# Patient Record
Sex: Female | Born: 1990 | Race: Black or African American | Hispanic: No | Marital: Married | Smoking: Never smoker
Health system: Southern US, Community
[De-identification: ages and names within clinical notes are randomized; demographics above are authoritative.]

## PROBLEM LIST (undated history)

## (undated) ENCOUNTER — Inpatient Hospital Stay (HOSPITAL_COMMUNITY): Payer: Self-pay

## (undated) DIAGNOSIS — D649 Anemia, unspecified: Secondary | ICD-10-CM

## (undated) DIAGNOSIS — J45909 Unspecified asthma, uncomplicated: Secondary | ICD-10-CM

## (undated) HISTORY — PX: NO PAST SURGERIES: SHX2092

---
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
## 2014-09-22 ENCOUNTER — Other Ambulatory Visit (HOSPITAL_COMMUNITY)
Admission: RE | Admit: 2014-09-22 | Discharge: 2014-09-22 | Disposition: A | Discharge status: Home / Self Care | Payer: 59 | Source: Ambulatory Visit | Attending: Internal Medicine | Admitting: Internal Medicine

## 2014-09-22 DIAGNOSIS — Z01411 Encounter for gynecological examination (general) (routine) with abnormal findings: Secondary | ICD-10-CM | POA: Insufficient documentation

## 2015-04-19 NOTE — L&D Delivery Note (Signed)
Delivery Note At 2:45 PM a viable female was delivered via Vaginal, Spontaneous Delivery (Presentation:ROA ;  ).  APGAR: 8, 9; weight pending .   Placenta status: Intact, spontaneous.  Marginal insertion of cord.  Cord:  with the following complications: Tight nuchal cord, surgically reduced .  Cord pH: n/a  Pt counseled on VAVD, bell vacuum pulled but not used.  Anesthesia:  Epidural Episiotomy: None Lacerations: Periurethral, vaginal laceration, right vaginal fornix Suture Repair: 4.0 Vicryl, 3.0 Monocryl, 2.0 Chromic Est. Blood Loss (mL):  150  Mom to postpartum.  Baby to Couplet care / Skin to Skin.  Geryl RankinsVARNADO, Dari Carpenito 01/19/2016, 3:26 PM

## 2015-06-01 LAB — OB RESULTS CONSOLE HEPATITIS B SURFACE ANTIGEN: HEP B S AG: NEGATIVE

## 2015-06-01 LAB — OB RESULTS CONSOLE RPR: RPR: NONREACTIVE

## 2015-06-01 LAB — OB RESULTS CONSOLE RUBELLA ANTIBODY, IGM: Rubella: IMMUNE

## 2015-06-01 LAB — OB RESULTS CONSOLE HIV ANTIBODY (ROUTINE TESTING): HIV: NONREACTIVE

## 2015-06-29 LAB — OB RESULTS CONSOLE GC/CHLAMYDIA
Chlamydia: NEGATIVE
Gonorrhea: NEGATIVE

## 2015-12-09 ENCOUNTER — Encounter (HOSPITAL_COMMUNITY): Payer: Self-pay | Admitting: *Deleted

## 2015-12-09 ENCOUNTER — Inpatient Hospital Stay (HOSPITAL_COMMUNITY)
Admission: AD | Admit: 2015-12-09 | Discharge: 2015-12-10 | Disposition: A | Discharge status: Home / Self Care | Payer: BLUE CROSS/BLUE SHIELD | Source: Ambulatory Visit | Attending: Obstetrics and Gynecology | Admitting: Obstetrics and Gynecology

## 2015-12-09 DIAGNOSIS — Z3A33 33 weeks gestation of pregnancy: Secondary | ICD-10-CM | POA: Diagnosis not present

## 2015-12-09 DIAGNOSIS — O26893 Other specified pregnancy related conditions, third trimester: Secondary | ICD-10-CM | POA: Diagnosis not present

## 2015-12-09 DIAGNOSIS — N898 Other specified noninflammatory disorders of vagina: Secondary | ICD-10-CM | POA: Diagnosis not present

## 2015-12-09 HISTORY — DX: Anemia, unspecified: D64.9

## 2015-12-09 LAB — WET PREP, GENITAL
Clue Cells Wet Prep HPF POC: NONE SEEN
SPERM: NONE SEEN
Trich, Wet Prep: NONE SEEN
YEAST WET PREP: NONE SEEN

## 2015-12-09 NOTE — MAU Provider Note (Signed)
Chief Complaint:  No chief complaint on file.     HPI: Megan MiresBrittnee Baskin is a 25 y.o. G1P0 at 7741w3d who presents to maternity admissions reporting her "water broke".  Earlier this evening, patient went to go to the bathroom, and when she pulled down her underwear, she noticed the underwear was completely wet, not soaked. She did not recall feeling any gush. She has not had any further leaking of fluid. No recent intercourse. No history of STDs. No abnormal vaginal discharge.   Denies contractions or vaginal bleeding. Good fetal movement.   Pregnancy Course:  Uncomplicated  Past Medical History: Past Medical History:  Diagnosis Date  . Anemia     Past obstetric history: OB History  Gravida Para Term Preterm AB Living  1            SAB TAB Ectopic Multiple Live Births               # Outcome Date GA Lbr Len/2nd Weight Sex Delivery Anes PTL Lv  1 Current               Past Surgical History: History reviewed. No pertinent surgical history.   Family History: No family history on file.  Social History: Social History  Substance Use Topics  . Smoking status: Never Smoker  . Smokeless tobacco: Never Used  . Alcohol use Not on file    Allergies: Allergies not on file  Meds:  No prescriptions prior to admission.    I have reviewed patient's Past Medical Hx, Surgical Hx, Family Hx, Social Hx, medications and allergies.   ROS:  Review of Systems  Physical Exam   Patient Vitals for the past 24 hrs:  BP Temp Temp src Pulse Resp SpO2 Height Weight  12/09/15 2346 125/55 98.3 F (36.8 C) Oral 75 18 - - -  12/09/15 2128 113/81 98.5 F (36.9 C) Oral 80 16 98 % 5\' 4"  (1.626 m) 202 lb (91.6 kg)   Constitutional: Well-developed, well-nourished female in no acute distress.  Cardiovascular: normal rate Respiratory: normal effort GI: Abd soft, non-tender, gravid appropriate for gestational age. Pos BS x 4 MS: Extremities nontender, no edema, normal ROM Neurologic: Alert and  oriented x 4.  GU: Neg CVAT.  Pelvic: NEFG, physiologic discharge, no blood, cervix clean. No CMT. NO POOLING.  Dilation: Closed Effacement (%): Thick Cervical Position: Posterior Exam by:: dR. Kerisha Goughnour   FHT:  Baseline 140, moderate variability, accelerations present, no decelerations Contractions: quiet, rare   Labs: Results for orders placed or performed during the hospital encounter of 12/09/15 (from the past 24 hour(s))  Wet prep, genital     Status: Abnormal   Collection Time: 12/09/15 11:13 PM  Result Value Ref Range   Yeast Wet Prep HPF POC NONE SEEN NONE SEEN   Trich, Wet Prep NONE SEEN NONE SEEN   Clue Cells Wet Prep HPF POC NONE SEEN NONE SEEN   WBC, Wet Prep HPF POC MANY (A) NONE SEEN   Sperm NONE SEEN    FERN TEST NEGATIVE   Imaging:  No results found.  MAU Course: - SSE: NO pooling, normal physiologic discharge - Negative fern - Wet mount: Negative, many WBC  11:55 PM - Spoke with Dr. Richardson Doppole and Dr. Su Hiltoberts, who agree with the plan   MDM: Plan of care reviewed with patient, including labs and tests ordered and medical treatment.   Assessment: 1. Vaginal discharge during pregnancy in third trimester     Plan: Discharge home in  stable condition.  Labor precautions and fetal kick counts Follow-up Information    COLE,TARA J., MD Follow up in 1 week(s).   Specialty:  Obstetrics and Gynecology Why:  Routine OB follow up Contact information: [address] [place] [id number] [phone]             Medication List    You have not been prescribed any medications.     [address] [place], [place] 12/09/2015 11:55 PM

## 2015-12-09 NOTE — MAU Note (Signed)
Pt reports at 1800 she went to the restroom and her underwear was wet, called the doctor and was told to come in. Has not noticed anymore fluid. Denies pain.

## 2015-12-09 NOTE — Discharge Instructions (Signed)

## 2015-12-10 ENCOUNTER — Encounter (HOSPITAL_COMMUNITY): Payer: Self-pay

## 2015-12-10 DIAGNOSIS — O26893 Other specified pregnancy related conditions, third trimester: Secondary | ICD-10-CM | POA: Diagnosis not present

## 2016-01-12 ENCOUNTER — Inpatient Hospital Stay (HOSPITAL_COMMUNITY)
Admission: AD | Admit: 2016-01-12 | Discharge: 2016-01-12 | Disposition: A | Discharge status: Home / Self Care | Payer: BLUE CROSS/BLUE SHIELD | Source: Ambulatory Visit | Attending: Obstetrics and Gynecology | Admitting: Obstetrics and Gynecology

## 2016-01-12 ENCOUNTER — Encounter (HOSPITAL_COMMUNITY): Payer: Self-pay | Admitting: *Deleted

## 2016-01-12 DIAGNOSIS — Z3493 Encounter for supervision of normal pregnancy, unspecified, third trimester: Secondary | ICD-10-CM | POA: Diagnosis present

## 2016-01-12 DIAGNOSIS — Z3A39 39 weeks gestation of pregnancy: Secondary | ICD-10-CM | POA: Diagnosis not present

## 2016-01-12 NOTE — Discharge Instructions (Signed)
Fetal Movement Counts °Patient Name: __________________________________________________ Patient Due Date: ____________________ °Performing a fetal movement count is highly recommended in high-risk pregnancies, but it is good for every pregnant woman to do. Your health care provider may ask you to start counting fetal movements at 28 weeks of the pregnancy. Fetal movements often increase: °· After eating a full meal. °· After physical activity. °· After eating or drinking something sweet or cold. °· At rest. °Pay attention to when you feel the baby is most active. This will help you notice a pattern of your baby's sleep and wake cycles and what factors contribute to an increase in fetal movement. It is important to perform a fetal movement count at the same time each day when your baby is normally most active.  °HOW TO COUNT FETAL MOVEMENTS °1. Find a quiet and comfortable area to sit or lie down on your left side. Lying on your left side provides the best blood and oxygen circulation to your baby. °2. Write down the day and time on a sheet of paper or in a journal. °3. Start counting kicks, flutters, swishes, rolls, or jabs in a 2-hour period. You should feel at least 10 movements within 2 hours. °4. If you do not feel 10 movements in 2 hours, wait 2-3 hours and count again. Look for a change in the pattern or not enough counts in 2 hours. °SEEK MEDICAL CARE IF: °· You feel less than 10 counts in 2 hours, tried twice. °· There is no movement in over an hour. °· The pattern is changing or taking longer each day to reach 10 counts in 2 hours. °· You feel the baby is not moving as he or she usually does. °Date: ____________ Movements: ____________ Start time: ____________ Finish time: ____________  °Date: ____________ Movements: ____________ Start time: ____________ Finish time: ____________ °Date: ____________ Movements: ____________ Start time: ____________ Finish time: ____________ °Date: ____________ Movements:  ____________ Start time: ____________ Finish time: ____________ °Date: ____________ Movements: ____________ Start time: ____________ Finish time: ____________ °Date: ____________ Movements: ____________ Start time: ____________ Finish time: ____________ °Date: ____________ Movements: ____________ Start time: ____________ Finish time: ____________ °Date: ____________ Movements: ____________ Start time: ____________ Finish time: ____________  °Date: ____________ Movements: ____________ Start time: ____________ Finish time: ____________ °Date: ____________ Movements: ____________ Start time: ____________ Finish time: ____________ °Date: ____________ Movements: ____________ Start time: ____________ Finish time: ____________ °Date: ____________ Movements: ____________ Start time: ____________ Finish time: ____________ °Date: ____________ Movements: ____________ Start time: ____________ Finish time: ____________ °Date: ____________ Movements: ____________ Start time: ____________ Finish time: ____________ °Date: ____________ Movements: ____________ Start time: ____________ Finish time: ____________  °Date: ____________ Movements: ____________ Start time: ____________ Finish time: ____________ °Date: ____________ Movements: ____________ Start time: ____________ Finish time: ____________ °Date: ____________ Movements: ____________ Start time: ____________ Finish time: ____________ °Date: ____________ Movements: ____________ Start time: ____________ Finish time: ____________ °Date: ____________ Movements: ____________ Start time: ____________ Finish time: ____________ °Date: ____________ Movements: ____________ Start time: ____________ Finish time: ____________ °Date: ____________ Movements: ____________ Start time: ____________ Finish time: ____________  °Date: ____________ Movements: ____________ Start time: ____________ Finish time: ____________ °Date: ____________ Movements: ____________ Start time: ____________ Finish  time: ____________ °Date: ____________ Movements: ____________ Start time: ____________ Finish time: ____________ °Date: ____________ Movements: ____________ Start time: ____________ Finish time: ____________ °Date: ____________ Movements: ____________ Start time: ____________ Finish time: ____________ °Date: ____________ Movements: ____________ Start time: ____________ Finish time: ____________ °Date: ____________ Movements: ____________ Start time: ____________ Finish time: ____________  °Date: ____________ Movements: ____________ Start time: ____________ Finish   time: ____________ °Date: ____________ Movements: ____________ Start time: ____________ Finish time: ____________ °Date: ____________ Movements: ____________ Start time: ____________ Finish time: ____________ °Date: ____________ Movements: ____________ Start time: ____________ Finish time: ____________ °Date: ____________ Movements: ____________ Start time: ____________ Finish time: ____________ °Date: ____________ Movements: ____________ Start time: ____________ Finish time: ____________ °Date: ____________ Movements: ____________ Start time: ____________ Finish time: ____________  °Date: ____________ Movements: ____________ Start time: ____________ Finish time: ____________ °Date: ____________ Movements: ____________ Start time: ____________ Finish time: ____________ °Date: ____________ Movements: ____________ Start time: ____________ Finish time: ____________ °Date: ____________ Movements: ____________ Start time: ____________ Finish time: ____________ °Date: ____________ Movements: ____________ Start time: ____________ Finish time: ____________ °Date: ____________ Movements: ____________ Start time: ____________ Finish time: ____________ °Date: ____________ Movements: ____________ Start time: ____________ Finish time: ____________  °Date: ____________ Movements: ____________ Start time: ____________ Finish time: ____________ °Date: ____________  Movements: ____________ Start time: ____________ Finish time: ____________ °Date: ____________ Movements: ____________ Start time: ____________ Finish time: ____________ °Date: ____________ Movements: ____________ Start time: ____________ Finish time: ____________ °Date: ____________ Movements: ____________ Start time: ____________ Finish time: ____________ °Date: ____________ Movements: ____________ Start time: ____________ Finish time: ____________ °Date: ____________ Movements: ____________ Start time: ____________ Finish time: ____________  °Date: ____________ Movements: ____________ Start time: ____________ Finish time: ____________ °Date: ____________ Movements: ____________ Start time: ____________ Finish time: ____________ °Date: ____________ Movements: ____________ Start time: ____________ Finish time: ____________ °Date: ____________ Movements: ____________ Start time: ____________ Finish time: ____________ °Date: ____________ Movements: ____________ Start time: ____________ Finish time: ____________ °Date: ____________ Movements: ____________ Start time: ____________ Finish time: ____________ °  °This information is not intended to replace advice given to you by your health care provider. Make sure you discuss any questions you have with your health care provider. °  °Document Released: 05/04/2006 Document Revised: 04/25/2014 Document Reviewed: 01/30/2012 °Elsevier Interactive Patient Education ©2016 [employer] °Braxton Hicks Contractions °Contractions of the uterus can occur throughout pregnancy. Contractions are not always a sign that you are in labor.  °WHAT ARE BRAXTON HICKS CONTRACTIONS?  °Contractions that occur before labor are called Braxton Hicks contractions, or false labor. Toward the end of pregnancy (32-34 weeks), these contractions can develop more often and may become more forceful. This is not true labor because these contractions do not result in opening (dilatation) and thinning of  the cervix. They are sometimes difficult to tell apart from true labor because these contractions can be forceful and people have different pain tolerances. You should not feel embarrassed if you go to the hospital with false labor. Sometimes, the only way to tell if you are in true labor is for your health care provider to look for changes in the cervix. °If there are no prenatal problems or other health problems associated with the pregnancy, it is completely safe to be sent home with false labor and await the onset of true labor. °HOW CAN YOU TELL THE DIFFERENCE BETWEEN TRUE AND FALSE LABOR? °False Labor °· The contractions of false labor are usually shorter and not as hard as those of true labor.   °· The contractions are usually irregular.   °· The contractions are often felt in the front of the lower abdomen and in the groin.   °· The contractions may go away when you walk around or change positions while lying down.   °· The contractions get weaker and are shorter lasting as time goes on.   °· The contractions do not usually become progressively stronger, regular, and closer together as with true labor.   °True Labor °· Contractions in true   labor last 30-70 seconds, become very regular, usually become more intense, and increase in frequency.   °· The contractions do not go away with walking.   °· The discomfort is usually felt in the top of the uterus and spreads to the lower abdomen and low back.   °· True labor can be determined by your health care provider with an exam. This will show that the cervix is dilating and getting thinner.   °WHAT TO REMEMBER °· Keep up with your usual exercises and follow other instructions given by your health care provider.   °· Take medicines as directed by your health care provider.   °· Keep your regular prenatal appointments.   °· Eat and drink lightly if you think you are going into labor.   °· If Braxton Hicks contractions are making you uncomfortable:   °¨ Change your  position from lying down or resting to walking, or from walking to resting.   °¨ Sit and rest in a tub of warm water.   °¨ Drink 2-3 glasses of water. Dehydration may cause these contractions.   °¨ Do slow and deep breathing several times an hour.   °WHEN SHOULD I SEEK IMMEDIATE MEDICAL CARE? °Seek immediate medical care if: °· Your contractions become stronger, more regular, and closer together.   °· You have fluid leaking or gushing from your vagina.   °· You have a fever.   °· You pass blood-tinged mucus.   °· You have vaginal bleeding.   °· You have continuous abdominal pain.   °· You have low back pain that you never had before.   °· You feel your baby's head pushing down and causing pelvic pressure.   °· Your baby is not moving as much as it used to.   °  °This information is not intended to replace advice given to you by your health care provider. Make sure you discuss any questions you have with your health care provider. °  °Document Released: 04/04/2005 Document Revised: 04/09/2013 Document Reviewed: 01/14/2013 °Elsevier Interactive Patient Education ©2016 [employer] ° °

## 2016-01-12 NOTE — MAU Note (Signed)
Pt reports uc's since 2300

## 2016-01-18 ENCOUNTER — Inpatient Hospital Stay (HOSPITAL_COMMUNITY): Payer: BLUE CROSS/BLUE SHIELD | Admitting: Anesthesiology

## 2016-01-18 ENCOUNTER — Encounter (HOSPITAL_COMMUNITY): Payer: Self-pay | Admitting: *Deleted

## 2016-01-18 ENCOUNTER — Inpatient Hospital Stay (HOSPITAL_COMMUNITY)
Admission: AD | Admit: 2016-01-18 | Discharge: 2016-01-21 | DRG: 775 | Disposition: A | Discharge status: Home / Self Care | Payer: BLUE CROSS/BLUE SHIELD | Source: Ambulatory Visit | Attending: Obstetrics and Gynecology | Admitting: Obstetrics and Gynecology

## 2016-01-18 DIAGNOSIS — Z8249 Family history of ischemic heart disease and other diseases of the circulatory system: Secondary | ICD-10-CM | POA: Diagnosis not present

## 2016-01-18 DIAGNOSIS — K5900 Constipation, unspecified: Secondary | ICD-10-CM | POA: Diagnosis present

## 2016-01-18 DIAGNOSIS — Z3A40 40 weeks gestation of pregnancy: Secondary | ICD-10-CM | POA: Diagnosis not present

## 2016-01-18 DIAGNOSIS — J45909 Unspecified asthma, uncomplicated: Secondary | ICD-10-CM | POA: Diagnosis present

## 2016-01-18 DIAGNOSIS — O411230 Chorioamnionitis, third trimester, not applicable or unspecified: Secondary | ICD-10-CM | POA: Diagnosis present

## 2016-01-18 DIAGNOSIS — O4202 Full-term premature rupture of membranes, onset of labor within 24 hours of rupture: Principal | ICD-10-CM | POA: Diagnosis present

## 2016-01-18 DIAGNOSIS — O4290 Premature rupture of membranes, unspecified as to length of time between rupture and onset of labor, unspecified weeks of gestation: Secondary | ICD-10-CM | POA: Diagnosis present

## 2016-01-18 DIAGNOSIS — O43123 Velamentous insertion of umbilical cord, third trimester: Secondary | ICD-10-CM | POA: Diagnosis present

## 2016-01-18 DIAGNOSIS — O9952 Diseases of the respiratory system complicating childbirth: Secondary | ICD-10-CM | POA: Diagnosis present

## 2016-01-18 LAB — CBC
HCT: 35.8 % — ABNORMAL LOW (ref 36.0–46.0)
Hemoglobin: 12 g/dL (ref 12.0–15.0)
MCH: 26.5 pg (ref 26.0–34.0)
MCHC: 33.5 g/dL (ref 30.0–36.0)
MCV: 79.2 fL (ref 78.0–100.0)
PLATELETS: 149 10*3/uL — AB (ref 150–400)
RBC: 4.52 MIL/uL (ref 3.87–5.11)
RDW: 14.7 % (ref 11.5–15.5)
WBC: 10.6 10*3/uL — AB (ref 4.0–10.5)

## 2016-01-18 LAB — TYPE AND SCREEN
ABO/RH(D): A POS
Antibody Screen: NEGATIVE

## 2016-01-18 LAB — ABO/RH: ABO/RH(D): A POS

## 2016-01-18 LAB — POCT FERN TEST

## 2016-01-18 MED ORDER — FENTANYL 2.5 MCG/ML BUPIVACAINE 1/10 % EPIDURAL INFUSION (WH - ANES)
14.0000 mL/h | INTRAMUSCULAR | Status: DC | PRN
  Administered 2016-01-18 – 2016-01-19 (×3): 14 mL/h via EPIDURAL
  Filled 2016-01-18 (×3): qty 125

## 2016-01-18 MED ORDER — LACTATED RINGERS IV SOLN: 500.0000 mL | INTRAVENOUS | Status: DC | PRN

## 2016-01-18 MED ORDER — HYDROXYZINE HCL 50 MG PO TABS
50.0000 mg | ORAL_TABLET | Freq: Four times a day (QID) | ORAL | Status: DC | PRN
  Filled 2016-01-18: qty 1

## 2016-01-18 MED ORDER — LIDOCAINE HCL (PF) 1 % IJ SOLN
INTRAMUSCULAR | Status: DC | PRN
  Administered 2016-01-18 (×2): 5 mL

## 2016-01-18 MED ORDER — OXYTOCIN 40 UNITS IN LACTATED RINGERS INFUSION - SIMPLE MED
1.0000 m[IU]/min | INTRAVENOUS | Status: DC
  Administered 2016-01-18: 1 m[IU]/min via INTRAVENOUS
  Filled 2016-01-18: qty 1000

## 2016-01-18 MED ORDER — SOD CITRATE-CITRIC ACID 500-334 MG/5ML PO SOLN
30.0000 mL | ORAL | Status: DC | PRN
Start: 2016-01-18 — End: 2016-01-19
  Filled 2016-01-18: qty 15

## 2016-01-18 MED ORDER — FENTANYL CITRATE (PF) 100 MCG/2ML IJ SOLN
100.0000 ug | INTRAMUSCULAR | Status: DC | PRN
  Administered 2016-01-18 (×2): 100 ug via INTRAVENOUS
  Filled 2016-01-18 (×2): qty 2

## 2016-01-18 MED ORDER — ACETAMINOPHEN 325 MG PO TABS
650.0000 mg | ORAL_TABLET | ORAL | Status: DC | PRN
  Administered 2016-01-19: 650 mg via ORAL
  Filled 2016-01-18: qty 2

## 2016-01-18 MED ORDER — LIDOCAINE HCL (PF) 1 % IJ SOLN
30.0000 mL | INTRAMUSCULAR | Status: DC | PRN
  Filled 2016-01-18: qty 30

## 2016-01-18 MED ORDER — FENTANYL CITRATE (PF) 100 MCG/2ML IJ SOLN: 50.0000 ug | INTRAMUSCULAR | Status: DC | PRN

## 2016-01-18 MED ORDER — DIPHENHYDRAMINE HCL 50 MG/ML IJ SOLN: 12.5000 mg | INTRAMUSCULAR | Status: DC | PRN

## 2016-01-18 MED ORDER — OXYTOCIN 40 UNITS IN LACTATED RINGERS INFUSION - SIMPLE MED
2.5000 [IU]/h | INTRAVENOUS | Status: DC
  Administered 2016-01-19: 2.5 [IU]/h via INTRAVENOUS
  Filled 2016-01-18: qty 1000

## 2016-01-18 MED ORDER — ONDANSETRON HCL 4 MG/2ML IJ SOLN
4.0000 mg | Freq: Four times a day (QID) | INTRAMUSCULAR | Status: DC | PRN
  Administered 2016-01-19: 4 mg via INTRAVENOUS
  Filled 2016-01-18: qty 2

## 2016-01-18 MED ORDER — PHENYLEPHRINE 40 MCG/ML (10ML) SYRINGE FOR IV PUSH (FOR BLOOD PRESSURE SUPPORT)
80.0000 ug | PREFILLED_SYRINGE | INTRAVENOUS | Status: DC | PRN
  Filled 2016-01-18: qty 10
  Filled 2016-01-18: qty 5

## 2016-01-18 MED ORDER — TERBUTALINE SULFATE 1 MG/ML IJ SOLN
0.2500 mg | Freq: Once | INTRAMUSCULAR | Status: DC | PRN
  Filled 2016-01-18: qty 1

## 2016-01-18 MED ORDER — OXYCODONE-ACETAMINOPHEN 5-325 MG PO TABS: 2.0000 | ORAL_TABLET | ORAL | Status: DC | PRN

## 2016-01-18 MED ORDER — EPHEDRINE 5 MG/ML INJ
10.0000 mg | INTRAVENOUS | Status: DC | PRN
Start: 2016-01-18 — End: 2016-01-19
  Filled 2016-01-18: qty 4

## 2016-01-18 MED ORDER — OXYTOCIN BOLUS FROM INFUSION
500.0000 mL | Freq: Once | INTRAVENOUS | Status: AC
  Administered 2016-01-19: 500 mL via INTRAVENOUS

## 2016-01-18 MED ORDER — ZOLPIDEM TARTRATE 5 MG PO TABS: 5.0000 mg | ORAL_TABLET | Freq: Every evening | ORAL | Status: DC | PRN

## 2016-01-18 MED ORDER — LACTATED RINGERS IV SOLN
INTRAVENOUS | Status: DC
  Administered 2016-01-18 – 2016-01-19 (×2): via INTRAVENOUS

## 2016-01-18 MED ORDER — OXYCODONE-ACETAMINOPHEN 5-325 MG PO TABS: 1.0000 | ORAL_TABLET | ORAL | Status: DC | PRN

## 2016-01-18 MED ORDER — EPHEDRINE 5 MG/ML INJ
10.0000 mg | INTRAVENOUS | Status: DC | PRN
  Filled 2016-01-18: qty 4

## 2016-01-18 MED ORDER — PHENYLEPHRINE 40 MCG/ML (10ML) SYRINGE FOR IV PUSH (FOR BLOOD PRESSURE SUPPORT)
80.0000 ug | PREFILLED_SYRINGE | INTRAVENOUS | Status: DC | PRN
  Filled 2016-01-18: qty 5

## 2016-01-18 MED ORDER — LACTATED RINGERS IV SOLN
500.0000 mL | Freq: Once | INTRAVENOUS | Status: AC
  Administered 2016-01-18: 500 mL via INTRAVENOUS

## 2016-01-18 NOTE — MAU Note (Signed)
Urine in lab 

## 2016-01-18 NOTE — MAU Note (Signed)
Had sex @ 0500 then ucs started around 0600; ?SROM since 0650;

## 2016-01-18 NOTE — Anesthesia Procedure Notes (Signed)
Epidural Patient location during procedure: OB  Staffing Anesthesiologist: Harrell Niehoff Performed: anesthesiologist   Preanesthetic Checklist Completed: patient identified, site marked, surgical consent, pre-op evaluation, timeout performed, IV checked, risks and benefits discussed and monitors and equipment checked  Epidural Patient position: sitting Prep: DuraPrep Patient monitoring: heart rate, continuous pulse ox and blood pressure Approach: right paramedian Location: L4-L5 Injection technique: LOR saline  Needle:  Needle type: Tuohy  Needle gauge: 17 G Needle length: 9 cm and 9 Needle insertion depth: 6 cm Catheter type: closed end flexible Catheter size: 20 Guage Catheter at skin depth: 10 cm Test dose: negative  Assessment Events: blood not aspirated, injection not painful, no injection resistance, negative IV test and no paresthesia  Additional Notes Patient identified. Risks/Benefits/Options discussed with patient including but not limited to bleeding, infection, nerve damage, paralysis, failed block, incomplete pain control, headache, blood pressure changes, nausea, vomiting, reactions to medication both or allergic, itching and postpartum back pain. Confirmed with bedside nurse the patient's most recent platelet count. Confirmed with patient that they are not currently taking any anticoagulation, have any bleeding history or any family history of bleeding disorders. Patient expressed understanding and wished to proceed. All questions were answered. Sterile technique was used throughout the entire procedure. Please see nursing notes for vital signs. Test dose was given through epidural needle and negative prior to continuing to dose epidural or start infusion. Warning signs of high block given to the patient including shortness of breath, tingling/numbness in hands, complete motor block, or any concerning symptoms with instructions to call for help. Patient was given  instructions on fall risk and not to get out of bed. All questions and concerns addressed with instructions to call with any issues.     

## 2016-01-18 NOTE — Anesthesia Preprocedure Evaluation (Signed)
Anesthesia Evaluation  Patient identified by MRN, date of birth, ID band Patient awake    Reviewed: Allergy & Precautions, H&P , NPO status , Patient's Chart, lab work & pertinent test results  History of Anesthesia Complications Negative for: history of anesthetic complications  Airway Mallampati: II  TM Distance: >3 FB Neck ROM: full    Dental no notable dental hx. (+) Teeth Intact   Pulmonary asthma ,    Pulmonary exam normal breath sounds clear to auscultation       Cardiovascular negative cardio ROS Normal cardiovascular exam Rhythm:regular Rate:Normal     Neuro/Psych negative neurological ROS  negative psych ROS   GI/Hepatic negative GI ROS, Neg liver ROS,   Endo/Other  negative endocrine ROS  Renal/GU negative Renal ROS  negative genitourinary   Musculoskeletal   Abdominal   Peds  Hematology negative hematology ROS (+)   Anesthesia Other Findings   Reproductive/Obstetrics (+) Pregnancy                             Anesthesia Physical Anesthesia Plan  ASA: II  Anesthesia Plan: Epidural   Post-op Pain Management:    Induction:   Airway Management Planned:   Additional Equipment:   Intra-op Plan:   Post-operative Plan:   Informed Consent: I have reviewed the patients History and Physical, chart, labs and discussed the procedure including the risks, benefits and alternatives for the proposed anesthesia with the patient or authorized representative who has indicated his/her understanding and acceptance.     Plan Discussed with:   Anesthesia Plan Comments:         Anesthesia Quick Evaluation  

## 2016-01-18 NOTE — Anesthesia Pain Management Evaluation Note (Signed)
  [facility] Pain Management Visit Note  Patient: Megan Cobb, 25 y.o., female  "Hello I am a member of the anesthesia team at [facility]. We have an anesthesia team available at all times to provide care throughout the hospital, including epidural management and anesthesia for C-section. I don't know your plan for the delivery whether it a natural birth, water birth, IV sedation, nitrous supplementation, doula or epidural, but we want to meet your pain goals."   1.Was your pain managed to your expectations on prior hospitalizations?   No prior hospitalizations  2.What is your expectation for pain management during this hospitalization?     Epidural  3.How can we help you reach that goal? Explaining the options for labor & pain control  Record the patient's initial score and the patient's pain goal.   Pain: 1  Pain Goal: 6 [facility] wants you to be able to say your pain was always managed very well.  Jomarion Mish 01/18/2016

## 2016-01-18 NOTE — H&P (Signed)
Megan Cobb is a 25 y.o. female G1 @ 40 1/7 weeks by an 11 week ultrasound presenting for ruptured membranes. Pt had intercourse overnight, then started having mild cramps.  She was able sleep overnight.  Upon awakening, pt still had cramping.  Reported SROM 0650. Clear. Pt states contractions are mild currently but were stronger previously.  Cervix is unchanged from office 3 days ago. Prenatal care with [facility] (Dr. Dion BodyVarnado).  OB History    Gravida Para Term Preterm AB Living   1             SAB TAB Ectopic Multiple Live Births                 Past Medical History:  Diagnosis Date  . Anemia    Past Surgical History:  Procedure Laterality Date  . NO PAST SURGERIES     Family History: family history includes Hypertension in her father and mother. Social History:  reports that she has never smoked. She has never used smokeless tobacco. She reports that she does not drink alcohol or use drugs.     Maternal Diabetes: No Genetic Screening: Normal Maternal Ultrasounds/Referrals: Normal Fetal Ultrasounds or other Referrals:  None Maternal Substance Abuse:  No Significant Maternal Medications:  None Significant Maternal Lab Results:  Lab values include: Group B Strep negative Other Comments:  None  Review of Systems  Constitutional: Negative for chills and fever.  Gastrointestinal: Positive for abdominal pain.   Maternal Medical History:  Reason for admission: Rupture of membranes.   Contractions: Onset was 6-12 hours ago.   Frequency: irregular.   Perceived severity is mild.    Fetal activity: Perceived fetal activity is normal.    Prenatal complications: no prenatal complications Prenatal Complications - Diabetes: none.      Blood pressure 122/68, pulse 91, temperature 98.2 F (36.8 C), temperature source Oral, resp. rate 16. Maternal Exam:  Uterine Assessment: Contraction strength is moderate.  Contraction frequency is irregular.   Abdomen: Patient  reports no abdominal tenderness. Estimated fetal weight is recent ultrasound less than 7 lbs.   Fetal presentation: vertex  Introitus: Normal vulva. Normal vagina.  Ferning test: positive.  Amniotic fluid character: clear. Pooling of mucoid discharge.  Pelvis: adequate for delivery.   Cervix: Cervix evaluated by digital exam.     Fetal Exam Fetal Monitor Review: Mode: fetoscope.   Baseline rate: 130s.  Variability: moderate (6-25 bpm).   Pattern: accelerations present, variable decelerations and early decelerations.    Fetal State Assessment: Category II - tracings are indeterminate.    Contractions every 8-11 minutes  Physical Exam  Constitutional: She is oriented to person, place, and time. She appears well-developed and well-nourished. No distress.  HENT:  Head: Normocephalic and atraumatic.  Eyes: EOM are normal.  Neck: Normal range of motion.  Respiratory: Effort normal and breath sounds normal. No respiratory distress.  GI: There is no tenderness.  Musculoskeletal: Normal range of motion. She exhibits no edema.  Neurological: She is alert and oriented to person, place, and time.  Skin: Skin is warm and dry.  Psychiatric: She has a normal mood and affect.    Prenatal labs: ABO, Rh:   Antibody:   Rubella:   RPR:    HBsAg:    HIV:    GBS:   Negative  Assessment/Plan: IUP @ 40 1/7 weeks PROM x4+ hours.  No s/sxs of infection. Admit for induction of labor. Start Pitocin. Prophylactic antibiotics 18-24 hours s/p ROM. Fentanyl, Nitrous  Oxide.  Epidural in active labor. Dr. Richardson Dopp to assume care at 1 pm,  CCOB at 7 pm.  Pt informed.  Tyion Boylen 01/18/2016, 11:15 AM

## 2016-01-18 NOTE — Progress Notes (Signed)
Subjective: Pt is a 25 yo G1PO at 40.1 IUP in early active labor.  Pt had SROM of clear fluid at 0650.  Pt is GBS negative.  Pt states contractions getting stronger and just received some pain medication  Objective: BP 110/78 (BP Location: Left Arm)   Pulse 86   Temp 97.9 F (36.6 C) (Oral)   Resp 16  No intake/output data recorded. No intake/output data recorded.  FHT: Category 1 UC:   regular, every 5 minutes SVE:   Dilation: 1 Effacement (%): Thick Station: -2 Exam by:: A. Gagliardo, RN Pitocin at 9 mu MVUs N/A  Assessment:  G1P0 at 40.1 in early active labor SROM x 12 hours Cat 1 strip Plan: Monitor progress.  Epideral when needed. Kenney HousemanNancy Jean Geoffrey Mankin CNM, MSN 01/18/2016, 8:06 PM

## 2016-01-19 ENCOUNTER — Encounter (HOSPITAL_COMMUNITY): Payer: Self-pay | Admitting: Obstetrics

## 2016-01-19 LAB — RPR: RPR: NONREACTIVE

## 2016-01-19 MED ORDER — TETANUS-DIPHTH-ACELL PERTUSSIS 5-2.5-18.5 LF-MCG/0.5 IM SUSP
0.5000 mL | Freq: Once | INTRAMUSCULAR | Status: AC
  Administered 2016-01-20: 0.5 mL via INTRAMUSCULAR

## 2016-01-19 MED ORDER — OXYCODONE-ACETAMINOPHEN 5-325 MG PO TABS: 2.0000 | ORAL_TABLET | ORAL | Status: DC | PRN

## 2016-01-19 MED ORDER — BENZOCAINE-MENTHOL 20-0.5 % EX AERO: 1.0000 "application " | INHALATION_SPRAY | CUTANEOUS | Status: DC | PRN

## 2016-01-19 MED ORDER — LORATADINE 10 MG PO TABS
10.0000 mg | ORAL_TABLET | Freq: Every day | ORAL | Status: DC
  Administered 2016-01-19 – 2016-01-21 (×3): 10 mg via ORAL
  Filled 2016-01-19 (×3): qty 1

## 2016-01-19 MED ORDER — ONDANSETRON HCL 4 MG PO TABS: 4.0000 mg | ORAL_TABLET | ORAL | Status: DC | PRN

## 2016-01-19 MED ORDER — ZOLPIDEM TARTRATE 5 MG PO TABS: 5.0000 mg | ORAL_TABLET | Freq: Every evening | ORAL | Status: DC | PRN

## 2016-01-19 MED ORDER — MAGNESIUM HYDROXIDE 400 MG/5ML PO SUSP: 30.0000 mL | ORAL | Status: DC | PRN

## 2016-01-19 MED ORDER — DIBUCAINE 1 % RE OINT: 1.0000 "application " | TOPICAL_OINTMENT | RECTAL | Status: DC | PRN

## 2016-01-19 MED ORDER — PRENATAL MULTIVITAMIN CH
1.0000 | ORAL_TABLET | Freq: Every day | ORAL | Status: DC
  Administered 2016-01-20 – 2016-01-21 (×2): 1 via ORAL
  Filled 2016-01-19 (×2): qty 1

## 2016-01-19 MED ORDER — CALCIUM CARBONATE ANTACID 500 MG PO CHEW: 2.0000 | CHEWABLE_TABLET | ORAL | Status: DC | PRN

## 2016-01-19 MED ORDER — WITCH HAZEL-GLYCERIN EX PADS: 1.0000 "application " | MEDICATED_PAD | CUTANEOUS | Status: DC | PRN

## 2016-01-19 MED ORDER — METHYLERGONOVINE MALEATE 0.2 MG/ML IJ SOLN: 0.2000 mg | INTRAMUSCULAR | Status: DC | PRN

## 2016-01-19 MED ORDER — DIPHENHYDRAMINE HCL 25 MG PO CAPS: 25.0000 mg | ORAL_CAPSULE | Freq: Four times a day (QID) | ORAL | Status: DC | PRN

## 2016-01-19 MED ORDER — SIMETHICONE 80 MG PO CHEW: 80.0000 mg | CHEWABLE_TABLET | ORAL | Status: DC | PRN

## 2016-01-19 MED ORDER — ACETAMINOPHEN 325 MG PO TABS
650.0000 mg | ORAL_TABLET | ORAL | Status: DC | PRN
  Administered 2016-01-20 (×4): 650 mg via ORAL
  Filled 2016-01-19 (×5): qty 2

## 2016-01-19 MED ORDER — DIPHENHYDRAMINE HCL 25 MG PO TABS
25.0000 mg | ORAL_TABLET | Freq: Four times a day (QID) | ORAL | Status: DC | PRN
  Filled 2016-01-19: qty 1

## 2016-01-19 MED ORDER — OXYTOCIN 40 UNITS IN LACTATED RINGERS INFUSION - SIMPLE MED: 2.5000 [IU]/h | INTRAVENOUS | Status: DC | PRN

## 2016-01-19 MED ORDER — ONDANSETRON HCL 4 MG/2ML IJ SOLN: 4.0000 mg | INTRAMUSCULAR | Status: DC | PRN

## 2016-01-19 MED ORDER — SENNOSIDES-DOCUSATE SODIUM 8.6-50 MG PO TABS
2.0000 | ORAL_TABLET | ORAL | Status: DC
  Administered 2016-01-20 (×2): 2 via ORAL
  Filled 2016-01-19 (×2): qty 2

## 2016-01-19 MED ORDER — ALBUTEROL SULFATE (2.5 MG/3ML) 0.083% IN NEBU
3.0000 mL | INHALATION_SOLUTION | RESPIRATORY_TRACT | Status: DC | PRN
  Administered 2016-01-19: 3 mL via RESPIRATORY_TRACT
  Filled 2016-01-19 (×2): qty 3

## 2016-01-19 MED ORDER — IBUPROFEN 600 MG PO TABS
600.0000 mg | ORAL_TABLET | Freq: Four times a day (QID) | ORAL | Status: DC
  Administered 2016-01-19 – 2016-01-21 (×8): 600 mg via ORAL
  Filled 2016-01-19 (×8): qty 1

## 2016-01-19 MED ORDER — SODIUM CHLORIDE 0.9 % IV SOLN
3.0000 g | Freq: Four times a day (QID) | INTRAVENOUS | Status: AC
  Administered 2016-01-19 – 2016-01-20 (×3): 3 g via INTRAVENOUS
  Filled 2016-01-19 (×3): qty 3

## 2016-01-19 MED ORDER — METHYLERGONOVINE MALEATE 0.2 MG PO TABS: 0.2000 mg | ORAL_TABLET | ORAL | Status: DC | PRN

## 2016-01-19 MED ORDER — SODIUM CHLORIDE 0.9 % IV SOLN
3.0000 g | Freq: Four times a day (QID) | INTRAVENOUS | Status: DC
  Administered 2016-01-19 (×2): 3 g via INTRAVENOUS
  Filled 2016-01-19 (×4): qty 3

## 2016-01-19 MED ORDER — COCONUT OIL OIL: 1.0000 "application " | TOPICAL_OIL | Status: DC | PRN

## 2016-01-19 MED ORDER — OXYCODONE-ACETAMINOPHEN 5-325 MG PO TABS
1.0000 | ORAL_TABLET | ORAL | Status: DC | PRN
  Administered 2016-01-21: 1 via ORAL
  Filled 2016-01-19: qty 1

## 2016-01-19 NOTE — Lactation Note (Signed)
This note was copied from a baby's chart. Lactation Consultation Note Initial visit at 6 hours of age.  [facility].  Mom noted to have large breasts and WNL everted nipples.  Breasts are semi compressible, Lc encouraged mom to hold breast while latching and to not let go or baby will slip off of breast.  Baby latched well with wide gape and few sucking bursts needing stimulation to maintain 15 minute feeding.  FOB at bedside supportive. [facility] resources given and discussed.  Encouraged to feed with early cues on demand.  Early newborn behavior discussed.  Hand expression demonstrated on alternate breast during feeding.  Mom reports seeing colostrum earlier.   Mom to call for assist as needed.     Patient Name: Megan Cobb ZOXWR'UToday's Date: 01/19/2016 Reason for consult: Initial assessment   Maternal Data Has patient been taught Hand Expression?: Yes Does the patient have breastfeeding experience prior to this delivery?: No  Feeding Feeding Type: Breast Fed Length of feed: 15 min  LATCH Score/Interventions Latch: Grasps breast easily, tongue down, lips flanged, rhythmical sucking.  Audible Swallowing: A few with stimulation Intervention(s): Skin to skin;Hand expression;Alternate breast massage  Type of Nipple: Everted at rest and after stimulation  Comfort (Breast/Nipple): Soft / non-tender     Hold (Positioning): Assistance needed to correctly position infant at breast and maintain latch. Intervention(s): Breastfeeding basics reviewed;Support Pillows;Position options;Skin to skin  LATCH Score: 8  Lactation Tools Discussed/Used     Consult Status Consult Status: Follow-up Date: 01/20/16 Follow-up type: In-patient    Verdell Kincannon, Arvella MerlesJana Lynn 01/19/2016, 9:11 PM

## 2016-01-19 NOTE — Progress Notes (Signed)
Megan Cobb is a 25 y.o. G1P0 at 4946w2d   Subjective: Pt  Comfortable with epidural.  Able to rest overnight.  Feeling a little pressure.  Objective: BP 116/71   Pulse (!) 109   Temp 98.2 F (36.8 C) (Oral)   Resp 18  No intake/output data recorded. No intake/output data recorded.  FHT:  FHR: 120s bpm, variability: minimal ,  accelerations:  Present,  decelerations:  Absent UC:   Triplets SVE:   Dilation: 7 Effacement (%): 90 Station: 0 Exam by:: Megan Cobb, CNM Cvx  Anterior lip is thick edematous, Station +1 Bloody show.  IUPC placed at 7 o'clock without difficulty.  Labs: Lab Results  Component Value Date   WBC 10.6 (H) 01/18/2016   HGB 12.0 01/18/2016   HCT 35.8 (L) 01/18/2016   MCV 79.2 01/18/2016   PLT 149 (L) 01/18/2016    Assessment / Plan: Induction of labor due to PROM,  progressing well on pitocin  Labor: Progressing slowly.  IUPC placed to monitor MVUs. Preeclampsia:  no signs or symptoms of toxicity Fetal Wellbeing:  Category I Pain Control:  Epidural I/D:  25 hour ruptured.  Unasyn started. Anticipated MOD:  NSVD  Melany Wiesman 01/19/2016, 8:35 AM

## 2016-01-19 NOTE — Progress Notes (Signed)
Subjective: Comfortable with epidural.  Would like to be checked Objective: BP 122/84   Pulse 81   Temp 97.8 F (36.6 C) (Oral)   Resp 18  No intake/output data recorded. No intake/output data recorded.  FHT: Category 12 UC:  Coupling than q  4 minutes} SVE:   4/90/0 Pitocin at 17 mu   Assessment:  Pt is a G1P0 at 40.2 IUP active labor Cat 1 strip  Plan: Anticipate NSVD Kenney HousemanNancy Jean Giovanna Kemmerer CNM, MSN 01/19/2016, 4:12 AM

## 2016-01-19 NOTE — Progress Notes (Signed)
Subjective: Pt comfortable with epideral.   Objective: BP 114/60   Pulse 69   Temp 98.2 F (36.8 C) (Oral)   Resp 16  No intake/output data recorded. No intake/output data recorded.  FHT: Category 1 UC:   regular, every 2-3  minutes SVE:   Dilation: 2.5 Effacement (%): Thick Station: -2 Exam by:: Mervin Kung Stewart, RN Pitocin at 13mu   Assessment:  Pt is G1P0 40.1 IUP active labor. Cat 1   Plan: Monitor progress Anticipate SVD  Kenney HousemanNancy Jean Prothero CNM, MSN 01/19/2016, 2:42 AM

## 2016-01-20 LAB — CBC
HEMATOCRIT: 31.8 % — AB (ref 36.0–46.0)
HEMOGLOBIN: 10.6 g/dL — AB (ref 12.0–15.0)
MCH: 26.8 pg (ref 26.0–34.0)
MCHC: 33.3 g/dL (ref 30.0–36.0)
MCV: 80.3 fL (ref 78.0–100.0)
Platelets: 123 10*3/uL — ABNORMAL LOW (ref 150–400)
RBC: 3.96 MIL/uL (ref 3.87–5.11)
RDW: 14.7 % (ref 11.5–15.5)
WBC: 14.6 10*3/uL — AB (ref 4.0–10.5)

## 2016-01-20 MED ORDER — BISACODYL 10 MG RE SUPP
10.0000 mg | Freq: Every day | RECTAL | Status: DC | PRN
  Filled 2016-01-20 (×2): qty 1

## 2016-01-20 NOTE — Progress Notes (Signed)
Postpartum day #1, NSVD  Subjective Pt was in the bathroom.  Denied any significant issues.  Temp:  [98.1 F (36.7 C)-101.6 F (38.7 C)] 98.1 F (36.7 C) (10/04 0544) Pulse Rate:  [81-142] 86 (10/04 0544) Resp:  [18-20] 19 (10/04 0544) BP: (120-152)/(56-119) 121/57 (10/04 0544) SpO2:  [100 %] 100 % (10/03 1830) Tmax 101.6 @ 1500  CBC    Component Value Date/Time   WBC 14.6 (H) 01/20/2016 0552   RBC 3.96 01/20/2016 0552   HGB 10.6 (L) 01/20/2016 0552   HCT 31.8 (L) 01/20/2016 0552   PLT 123 (L) 01/20/2016 0552   MCV 80.3 01/20/2016 0552   MCH 26.8 01/20/2016 0552   MCHC 33.3 01/20/2016 0552   RDW 14.7 01/20/2016 0552     A/P: S/p SVD doing well. Presumed chorioamnionitis.  On Unasyn x 24 hours post delivery.  Almost afebrile 24 hours. Routine postpartum care. Lactation support. Discharge late tomorrow.  Will come by to see pt later. Geryl Cobb, Megan Granados 01/20/2016, 12:37 PM

## 2016-01-20 NOTE — Progress Notes (Signed)
In to see pt. Doing well.  C/o constipation.  Trying to drink coffee and ginger ale to have a BM.  Bleeding is normal.  Pt denies diagnosis of anxiety or depression but can get overwhelmed if she feels stressed out. Gen:  NAD Abd:  Fundus firm Ext: +Edema, no calf tenderness  A/P See previous note. Constipation.  Dulcolax suppository PR. F/u in 2 weeks to assess for Postpartum anxiety. Dr. Charlotta Newtonzan to discharge tomorrow.

## 2016-01-20 NOTE — Lactation Note (Signed)
This note was copied from a baby's chart. Lactation Consultation Note  Patient Name: Megan Cobb [id number]'JToday's Date: Cobb Reason for consult: Follow-up assessment Infant was NPO this morning after vomiting twice without recent feeding, KUB done showed prominent gas followed by lateral decubitus to r/o free air. Baby now able to go to breast. Assisted Mom at this visit with positioning and latch. Mom has large pendulous breast but baby was able to latch well. Baby demonstrated few good suckling bursts off/on. Basic teaching reviewed with Mom. Advised baby should be at breast 8-12 times or more in 24 hours, nursing for 15-30 minutes both breasts some feedings. Encouraged Mom to call for assist as needed with latch.   Maternal Data    Feeding Feeding Type: Breast Fed  LATCH Score/Interventions Latch: Repeated attempts needed to sustain latch, nipple held in mouth throughout feeding, stimulation needed to elicit sucking reflex. Intervention(s): Adjust position;Assist with latch;Breast massage;Breast compression  Audible Swallowing: A few with stimulation  Type of Nipple: Everted at rest and after stimulation  Comfort (Breast/Nipple): Soft / non-tender     Hold (Positioning): Assistance needed to correctly position infant at breast and maintain latch. Intervention(s): Breastfeeding basics reviewed;Support Pillows;Position options;Skin to skin  LATCH Score: 7  Lactation Tools Discussed/Used     Consult Status Consult Status: Follow-up Date: 01/21/16 Follow-up type: In-patient    Alfred LevinsGranger, Johnwesley Lederman Megan Cobb, 3:42 PM

## 2016-01-20 NOTE — Progress Notes (Signed)
Dr. Gunnar BullaVernado ordered Dulcolax supp, per pt's request.  Pt decided NOT to use it during evening shift d/t [facility].  She states she would prefer to wait until tomorrow if unable to have BM in morning.  Susie CassetteMoira T Deloyd Handy

## 2016-01-20 NOTE — Anesthesia Postprocedure Evaluation (Signed)
Anesthesia Post Note  Patient: Megan Cobb  Procedure(s) Performed: * No procedures listed *  Patient location during evaluation: Mother Baby Anesthesia Type: Epidural Level of consciousness: awake and alert and oriented Pain management: pain level controlled Vital Signs Assessment: post-procedure vital signs reviewed and stable Respiratory status: spontaneous breathing, nonlabored ventilation and respiratory function stable Cardiovascular status: stable Postop Assessment: no headache, no backache, patient able to bend at knees, no signs of nausea or vomiting and adequate PO intake Anesthetic complications: no Comments: Discussed pain management with patient and medication options.     Last Vitals:  Vitals:   01/19/16 2200 01/20/16 0544  BP: (!) 120/56 (!) 121/57  Pulse: 81 86  Resp: 20 19  Temp: 37.1 C 36.7 C    Last Pain:  Vitals:   01/20/16 0702  TempSrc:   PainSc: 2    Pain Goal: Patients Stated Pain Goal: 2 (01/19/16 1830)  Current Pain level: 4               Jailynne Opperman

## 2016-01-21 MED ORDER — DOCUSATE SODIUM 100 MG PO CAPS: 100.0000 mg | ORAL_CAPSULE | Freq: Two times a day (BID) | ORAL | 0 refills | Status: DC

## 2016-01-21 MED ORDER — OXYCODONE-ACETAMINOPHEN 5-325 MG PO TABS: 1.0000 | ORAL_TABLET | Freq: Four times a day (QID) | ORAL | 0 refills | Status: DC | PRN

## 2016-01-21 MED ORDER — INFLUENZA VAC SPLIT QUAD 0.5 ML IM SUSY
0.5000 mL | PREFILLED_SYRINGE | INTRAMUSCULAR | Status: AC
  Administered 2016-01-21: 0.5 mL via INTRAMUSCULAR

## 2016-01-21 MED ORDER — IBUPROFEN 600 MG PO TABS
600.0000 mg | ORAL_TABLET | Freq: Four times a day (QID) | ORAL | 0 refills | Status: AC
Start: 2016-01-21 — End: ?

## 2016-01-21 NOTE — Lactation Note (Signed)
This note was copied from a baby's chart. Lactation Consultation Note  Patient Name: Megan Alene MiresBrittnee Cobb Today's Date: 01/21/2016  Follow up visit made prior to discharge.  Mom states baby sometimes latches easily and comes off and on at times.  RN observed and assisted with a feeding this AM with a latch score of 9.  Discharge teaching done including milk coming to volume, engorgement treatment, keeping a feeding diary, cluster feeding and taking care of herself.  Lactation outpatient services and support reviewed and encouraged.   Maternal Data    Feeding Feeding Type: Breast Fed  LATCH Score/Interventions Latch: Grasps breast easily, tongue down, lips flanged, rhythmical sucking. Intervention(s): Teach feeding cues;Skin to skin;Waking techniques Intervention(s): Adjust position;Assist with latch;Breast compression  Audible Swallowing: A few with stimulation Intervention(s): Skin to skin;Hand expression Intervention(s): Skin to skin;Hand expression  Type of Nipple: Everted at rest and after stimulation Intervention(s): No intervention needed  Comfort (Breast/Nipple): Soft / non-tender     Hold (Positioning): No assistance needed to correctly position infant at breast. Intervention(s): Breastfeeding basics reviewed;Support Pillows;Position options;Skin to skin  LATCH Score: 9  Lactation Tools Discussed/Used     Consult Status      Huston FoleyMOULDEN, Lamel Mccarley S 01/21/2016, 10:50 AM

## 2016-01-21 NOTE — Progress Notes (Signed)
Postpartum day #2, NSVD  Subjective Resting comfortably in bed.  No F/C/CP/SOB.  Tolerating gen diet.  +flatus, +BM.  Ambulating and voiding freely.  Lochia moderate.  Overall doing well and reports no acute complaints.  Temp:  [98.2 F (36.8 C)-98.5 F (36.9 C)] 98.5 F (36.9 C) (10/05 0542) Pulse Rate:  [68-73] 68 (10/05 0542) Resp:  [18] 18 (10/05 0542) BP: (126-129)/(73-74) 129/73 (10/05 0542) SpO2:  [98 %] 98 % (10/04 1830)   Gen: NAD CV: RRR Lungs: CTAB Abd: soft, non-tender Uterus firm, below umbilicus, non-tender Ext: no edema, no calf tenderness bilaterally  CBC Latest Ref Rng & Units 01/20/2016 01/18/2016  WBC 4.0 - 10.5 K/uL 14.6(H) 10.6(H)  Hemoglobin 12.0 - 15.0 g/dL 10.6(L) 12.0  Hematocrit 36.0 - 46.0 % 31.8(L) 35.8(L)  Platelets 150 - 400 K/uL 123(L) 149(L)    A/P: 25yo G1P1001 s/p NSVD, PPD#2 -Presumed chorioamnionitis: Afebrile for over 24hr, s/p Unasyn -Routine postpartum care. -Lactation support, breastfeeding mother - Meeting postpartum milestones appropriately, plan for discharge home today  Myna HidalgoJennifer Yafet Cline, DO [phone] (pager) [phone] (office)

## 2016-01-21 NOTE — Discharge Instructions (Signed)

## 2016-01-21 NOTE — Plan of Care (Signed)
Problem: Nutritional: Goal: Mother's verbalization of comfort with breastfeeding process will improve Outcome: Completed/Met Date Met: 01/21/16 Pt verbalizes comfort with breastfeeding.  RN visualized latch and pt able to position infant and obtain a deep latch with ease.  Pt and FOB instructed to check the baby's bottom lip to make sure it is flanged out and also instructed how to pull lip down.

## 2016-01-21 NOTE — Lactation Note (Signed)
This note was copied from a baby's chart. Lactation Consultation Note Mom stated that baby hasn't been interested in BF. Mom states that baby will get on the breast will not stay latched and feed long.  Mom has large breast w/short shaft nipples that are compressible. Latched well in football position. Noted good suckling and tugging on breast.  Educated on on newborn feeding behavior. Positioned w/pillows and comfort.  Patient Name: Girl Alene MiresBrittnee Baskin [id number]'s Date: 01/21/2016 Reason for consult: Follow-up assessment   Maternal Data    Feeding Feeding Type: Breast Fed Length of feed: 10 min (still bf)  LATCH Score/Interventions Latch: Repeated attempts needed to sustain latch, nipple held in mouth throughout feeding, stimulation needed to elicit sucking reflex. Intervention(s): Teach feeding cues;Waking techniques Intervention(s): Adjust position;Assist with latch;Breast massage;Breast compression  Audible Swallowing: A few with stimulation Intervention(s): Hand expression;Skin to skin  Type of Nipple: Everted at rest and after stimulation  Comfort (Breast/Nipple): Soft / non-tender     Hold (Positioning): Assistance needed to correctly position infant at breast and maintain latch. Intervention(s): Breastfeeding basics reviewed;Support Pillows;Position options;Skin to skin  LATCH Score: 7  Lactation Tools Discussed/Used     Consult Status Consult Status: Follow-up Date: 01/21/16 (in pm) Follow-up type: In-patient    Charyl DancerCARVER, Elzie Sheets G 01/21/2016, 5:23 AM

## 2016-01-25 NOTE — Discharge Summary (Signed)
OB Discharge Summary     Patient Name: Megan Cobb DOB: 1990/05/23 MRN: [id number]  Date of admission: 01/18/2016 Delivering MD: Geryl Rankins   Date of discharge: 01/21/2016  Admitting diagnosis: Premature rupture of membrane Intrauterine pregnancy: [redacted]w[redacted]d     Secondary diagnosis:  Active Problems:   PROM (premature rupture of membranes)  Additional problems: Asthma     Discharge diagnosis: Term Pregnancy Delivered                                                                                                Post partum procedures:none  Augmentation: Pitocin and IUPC placement  Complications: None  Hospital course:  Onset of Labor With Vaginal Delivery-Vacuum assisted     25 y.o. yo G1P1001 at [redacted]w[redacted]d was admitted in Latent Labor on 01/18/2016. Patient had an uncomplicated labor course as follows:  Membrane Rupture Time/Date: 6:50 AM ,01/18/2016   Intrapartum Procedures: Episiotomy: None [1]                                         Lacerations:  Periurethral [8]  Patient had a delivery of a Viable infant. 01/19/2016  Information for the patient's newborn:  Aamani, Moose [[id number]]  Delivery Method: Vaginal, Spontaneous Delivery (Filed from Delivery Summary)    Pateint had an uncomplicated postpartum course.  She is ambulating, tolerating a regular diet, passing flatus, and urinating well. Patient is discharged home in stable condition on 01/21/16.    Physical exam Vitals:   01/19/16 2200 01/20/16 0544 01/20/16 1830 01/21/16 0542  BP: (!) 120/56 (!) 121/57 126/74 129/73  Pulse: 81 86 73 68  Resp: 20 19 18 18   Temp: 98.8 F (37.1 C) 98.1 F (36.7 C) 98.2 F (36.8 C) 98.5 F (36.9 C)  TempSrc:  Oral Oral Oral  SpO2:   98%    General: alert, cooperative and no distress Lochia: appropriate Uterine Fundus: firm, below umbilicus Incision: N/A DVT Evaluation: No evidence of DVT seen on physical exam. Labs: Lab Results  Component Value Date   WBC  14.6 (H) 01/20/2016   HGB 10.6 (L) 01/20/2016   HCT 31.8 (L) 01/20/2016   MCV 80.3 01/20/2016   PLT 123 (L) 01/20/2016   No flowsheet data found.  Discharge instruction: per After Visit Summary and "Baby and Me Booklet".  After visit meds:    Medication List    TAKE these medications   acetaminophen 500 MG tablet Commonly known as:  TYLENOL Take 500-1,000 mg by mouth every 6 (six) hours as needed for mild pain, moderate pain or headache.   albuterol 108 (90 Base) MCG/ACT inhaler Commonly known as:  PROVENTIL HFA;VENTOLIN HFA Inhale 2 puffs into the lungs every 6 (six) hours as needed for wheezing or shortness of breath.   calcium carbonate 500 MG chewable tablet Commonly known as:  TUMS - dosed in mg elemental calcium Chew 2 tablets by mouth as needed for indigestion or heartburn.   cetirizine 10 MG tablet Commonly known as:  ZYRTEC Take 10 mg by mouth daily as needed for allergies.   diphenhydrAMINE 25 MG tablet Commonly known as:  BENADRYL Take 25 mg by mouth every 6 (six) hours as needed for itching, allergies or sleep.   docusate sodium 100 MG capsule Commonly known as:  COLACE Take 1 capsule (100 mg total) by mouth 2 (two) times daily.   ibuprofen 600 MG tablet Commonly known as:  ADVIL,MOTRIN Take 1 tablet (600 mg total) by mouth every 6 (six) hours.   multivitamin-prenatal 27-0.8 MG Tabs tablet Take 1 tablet by mouth at bedtime.   oxyCODONE-acetaminophen 5-325 MG tablet Commonly known as:  PERCOCET/ROXICET Take 1 tablet by mouth every 6 (six) hours as needed for moderate pain (pain scale 4-7).       Diet: routine diet  Activity: Advance as tolerated. Pelvic rest for 6 weeks.   Outpatient follow up:6 weeks Follow up Appt:No future appointments. Follow up Visit:No Follow-up on file.  Postpartum contraception: Not Discussed  Newborn Data: Live born female  Birth Weight: 6 lb 7.7 oz (2940 g) APGAR: 8, 9  Baby Feeding: Breast Disposition:home  with mother   01/25/2016 Myna HidalgoZAN, Conner Neiss, M, DO

## 2016-03-18 ENCOUNTER — Telehealth (HOSPITAL_COMMUNITY): Payer: Self-pay | Admitting: Lactation Services

## 2016-03-18 NOTE — Telephone Encounter (Signed)
Baby now 648 weeks old and Mom returning to work. She asked about breast milk storage and how long breast milk is good after being thawed from freezer. Discussed breast milk storage guidelines, advised to refer to Baby N Me booklet, page 25 or to go on-line for resources. Mom reports decrease in milk supply. She is pumping every 3 hours for 15 minutes now receiving 8-10 oz/day. Previously she was only pumping 3 times per day and lost her milk supply. Advised to continue to pump every 3 hours for 15 minutes. Discussed Fenugreek supplements 610 mg 3 caps/TID to increase milk volume. Call for further questions/concerns.

## 2018-08-20 ENCOUNTER — Emergency Department (HOSPITAL_COMMUNITY): Payer: Medicaid Other

## 2018-08-20 ENCOUNTER — Other Ambulatory Visit: Payer: Self-pay

## 2018-08-20 ENCOUNTER — Emergency Department (HOSPITAL_COMMUNITY)
Admission: EM | Admit: 2018-08-20 | Discharge: 2018-08-20 | Disposition: A | Discharge status: Home / Self Care | Payer: Medicaid Other | Attending: Emergency Medicine | Admitting: Emergency Medicine

## 2018-08-20 ENCOUNTER — Encounter (HOSPITAL_COMMUNITY): Payer: Self-pay

## 2018-08-20 DIAGNOSIS — Y929 Unspecified place or not applicable: Secondary | ICD-10-CM | POA: Diagnosis not present

## 2018-08-20 DIAGNOSIS — J45909 Unspecified asthma, uncomplicated: Secondary | ICD-10-CM | POA: Diagnosis not present

## 2018-08-20 DIAGNOSIS — S61210A Laceration without foreign body of right index finger without damage to nail, initial encounter: Secondary | ICD-10-CM | POA: Insufficient documentation

## 2018-08-20 DIAGNOSIS — Z79899 Other long term (current) drug therapy: Secondary | ICD-10-CM | POA: Diagnosis not present

## 2018-08-20 DIAGNOSIS — Y999 Unspecified external cause status: Secondary | ICD-10-CM | POA: Insufficient documentation

## 2018-08-20 DIAGNOSIS — W268XXA Contact with other sharp object(s), not elsewhere classified, initial encounter: Secondary | ICD-10-CM | POA: Insufficient documentation

## 2018-08-20 DIAGNOSIS — S6992XA Unspecified injury of left wrist, hand and finger(s), initial encounter: Secondary | ICD-10-CM | POA: Diagnosis present

## 2018-08-20 DIAGNOSIS — Y93G1 Activity, food preparation and clean up: Secondary | ICD-10-CM | POA: Diagnosis not present

## 2018-08-20 HISTORY — DX: Unspecified asthma, uncomplicated: J45.909

## 2018-08-20 NOTE — ED Triage Notes (Signed)
States cut right index finger on cutting slicer while cutting zucchini with small laceration noted to tip of finger bleeding controlled.

## 2018-08-20 NOTE — Discharge Instructions (Addendum)
Please see the information and instructions below regarding your visit.  Your diagnoses today include:  1. Laceration of right index finger without damage to nail, foreign body presence unspecified, initial encounter      Tests performed today include: X-ray of the affected area that did not show any foreign bodies or broken bones Vital signs. See below for your results today.   Medications prescribed:   Take any prescribed medications only as directed.  Ibuprofen alternating with Tylenol for pain.   Home care instructions:  Follow any educational materials and wound care instructions contained in this packet.   Keep affected area above the level of your heart when possible to minimize swelling. Wash area gently twice a day with warm soapy water. Do not apply alcohol or hydrogen peroxide directly over a wound. Cover the area if it is draining or weeping. Keep the bandage in place for 24 hours and refrain from getting the wound wet for 24 hours. After that, you may get the area wet, but please ensure that you dry it completely afterwards.  Please refrain from soaking glue for long periods of time.   Follow-up instructions:  Return instructions:  Return to the Emergency Department if you have: Fever Worsening pain Worsening swelling of the wound Pus draining from the wound Redness of the skin that moves away from the wound, especially if it streaks away from the affected area  Any other emergent concerns  Your vital signs today were: BP 129/75    Pulse 83    Temp 98.5 F (36.9 C) (Oral)    Resp 16    Ht 5\' 4"  (1.626 m)    Wt 76.7 kg    LMP 08/12/2018    SpO2 99%    BMI 29.01 kg/m  If your blood pressure (BP) was elevated on multiple readings during this visit above 130 for the top number or above 80 for the bottom number, please have this repeated by your primary care provider within one month. --------------  Thank you for allowing [place] to participate in your care today! It was  a pleasure taking care of you.

## 2018-08-20 NOTE — ED Provider Notes (Signed)
[facility]-EMERGENCY DEPT Provider Note   CSN: [id number] Arrival date & time: 08/20/18  2001    History   Chief Complaint Chief Complaint  Patient presents with  . Extremity Laceration    HPI Megan Cobb is a 28 y.o. female.     HPI  Patient is a 27 year old female the past medical history of anemia and asthma presenting for laceration to right index finger.  Patient reports that she was cutting zucchini with a mandolin when it slid into her finger and lacerated it.  Patient reports bleeding was controlled with compression at the scene.  She denies any loss of sensation in the distal tip of her finger.  She is able to fully flex and extend the finger.  Last tetanus shot 5 years ago.  Past Medical History:  Diagnosis Date  . Anemia   . Asthma     Patient Active Problem List   Diagnosis Date Noted  . PROM (premature rupture of membranes) 01/18/2016    Past Surgical History:  Procedure Laterality Date  . NO PAST SURGERIES       OB History    Gravida  1   Para  1   Term  1   Preterm      AB      Living  1     SAB      TAB      Ectopic      Multiple  0   Live Births  1            Home Medications    Prior to Admission medications   Medication Sig Start Date End Date Taking? Authorizing Provider  acetaminophen (TYLENOL) 500 MG tablet Take 500-1,000 mg by mouth every 6 (six) hours as needed for mild pain, moderate pain or headache.     [provider]  albuterol (PROVENTIL HFA;VENTOLIN HFA) 108 (90 Base) MCG/ACT inhaler Inhale 2 puffs into the lungs every 6 (six) hours as needed for wheezing or shortness of breath.    [provider]  calcium carbonate (TUMS - DOSED IN MG ELEMENTAL CALCIUM) 500 MG chewable tablet Chew 2 tablets by mouth as needed for indigestion or heartburn.    [provider]  cetirizine (ZYRTEC) 10 MG tablet Take 10 mg by mouth daily as needed for allergies.    [provider]  diphenhydrAMINE (BENADRYL) 25 MG tablet Take 25 mg by mouth every 6 (six) hours as needed for itching, allergies or sleep.    [provider]  docusate sodium (COLACE) 100 MG capsule Take 1 capsule (100 mg total) by mouth 2 (two) times daily. 01/21/16   Myna Hidalgo, DO  ibuprofen (ADVIL,MOTRIN) 600 MG tablet Take 1 tablet (600 mg total) by mouth every 6 (six) hours. 01/21/16   Myna Hidalgo, DO  oxyCODONE-acetaminophen (PERCOCET/ROXICET) 5-325 MG tablet Take 1 tablet by mouth every 6 (six) hours as needed for moderate pain (pain scale 4-7). 01/21/16   Myna Hidalgo, DO  Prenatal Vit-Fe Fumarate-FA (MULTIVITAMIN-PRENATAL) 27-0.8 MG TABS tablet Take 1 tablet by mouth at bedtime.     [provider]    Family History Family History  Problem Relation Age of Onset  . Hypertension Mother   . Hypertension Father     Social History Social History   Tobacco Use  . Smoking status: Never Smoker  . Smokeless tobacco: Never Used  Substance Use Topics  . Alcohol use: No  . Drug use: No  Allergies   Patient has no known allergies.   Review of Systems Review of Systems  Musculoskeletal: Negative for arthralgias.  Skin: Positive for wound.  Neurological: Negative for weakness and numbness.     Physical Exam Updated Vital Signs BP 129/75   Pulse 83   Temp 98.5 F (36.9 C) (Oral)   Resp 16   Ht 5\' 4"  (1.626 m)   Wt 76.7 kg   LMP 08/12/2018   SpO2 99%   BMI 29.01 kg/m   Physical Exam Vitals signs and nursing note reviewed.  Constitutional:      General: She is not in acute distress.    Appearance: She is well-developed. She is not diaphoretic.     Comments: Sitting comfortably in bed.  HENT:     Head: Normocephalic and atraumatic.  Eyes:     General:        Right eye: No discharge.        Left eye: No discharge.     Conjunctiva/sclera: Conjunctivae normal.     Comments: EOMs normal to gross examination.  Neck:     Musculoskeletal:  Normal range of motion.  Cardiovascular:     Rate and Rhythm: Normal rate and regular rhythm.     Comments: Intact, 2+ left radial pulse. Abdominal:     General: There is no distension.  Musculoskeletal: Normal range of motion.  Skin:    General: Skin is warm and dry.     Comments: Patient has a 1 cm superficial laceration to the distal tip of her right index finger.  It is hemostatic.  No bone exposure.  Patient is able to fully flex and extend the right index finger without difficulty.  She has intact capillary refill distally.  Neurological:     Mental Status: She is alert.     Comments: Cranial nerves intact to gross observation. Patient moves extremities without difficulty.  Psychiatric:        Behavior: Behavior normal.        Thought Content: Thought content normal.        Judgment: Judgment normal.      ED Treatments / Results  Labs (all labs ordered are listed, but only abnormal results are displayed) Labs Reviewed - No data to display  EKG None  Radiology Dg Finger Index Right  Result Date: 08/20/2018 CLINICAL DATA:  Right finger laceration to the distal phalanx while cutting zucchini EXAM: RIGHT INDEX FINGER 2+V COMPARISON:  None. FINDINGS: There is no evidence of fracture or dislocation. There is no evidence of arthropathy or other focal bone abnormality. Soft tissues are unremarkable. IMPRESSION: Negative. Electronically Signed   By: Sebastian AcheAllen  Grady M.D.   On: 08/20/2018 20:57    Procedures .Marland Kitchen.Laceration Repair Date/Time: 08/20/2018 9:45 PM Performed by: Elisha PonderMurray, Kishon Garriga B, PA-C Authorized by: Elisha PonderMurray, Seher Schlagel B, PA-C   Consent:    Consent obtained:  Verbal   Consent given by:  Patient   Risks discussed:  Infection and pain Anesthesia (see MAR for exact dosages):    Anesthesia method:  None Laceration details:    Location:  Finger   Finger location:  R index finger   Length (cm):  1 Repair type:    Repair type:  Simple Pre-procedure details:    Preparation:   Patient was prepped and draped in usual sterile fashion Exploration:    Contaminated: no   Treatment:    Area cleansed with:  Betadine and saline   Amount of cleaning:  Standard Skin repair:  Repair method:  Tissue adhesive Approximation:    Approximation:  Close Post-procedure details:    Dressing:  Non-adherent dressing   Patient tolerance of procedure:  Tolerated well, no immediate complications   (including critical care time)  Medications Ordered in ED Medications - No data to display   Initial Impression / Assessment and Plan / ED Course  I have reviewed the triage vital signs and the nursing notes.  Pertinent labs & imaging results that were available during my care of the patient were reviewed by me and considered in my medical decision making (see chart for details).        Patient is well-appearing neurovascular tach the right index finger.  Patient has a superficial laceration to the distal tip.  No bony involvement on x-ray.  Laceration amenable to closure with Dermabond.  This was performed in emergency department.  Patient was informed of proper wound care protocol and to return for any increasing pain, swelling, erythema, or purulent drainage.  Patient is in understanding and agrees with the plan of care.  Final Clinical Impressions(s) / ED Diagnoses   Final diagnoses:  Laceration of right index finger without damage to nail, foreign body presence unspecified, initial encounter    ED Discharge Orders    None       Delia Chimes 08/20/18 2146    Maia Plan, MD 08/20/18 2203

## 2018-09-27 ENCOUNTER — Other Ambulatory Visit: Payer: Self-pay | Admitting: Internal Medicine

## 2018-09-27 DIAGNOSIS — N644 Mastodynia: Secondary | ICD-10-CM

## 2018-10-09 ENCOUNTER — Other Ambulatory Visit: Payer: Self-pay | Admitting: Internal Medicine

## 2018-10-09 ENCOUNTER — Other Ambulatory Visit: Payer: Self-pay

## 2018-10-09 ENCOUNTER — Ambulatory Visit
Admission: RE | Admit: 2018-10-09 | Discharge: 2018-10-09 | Disposition: A | Discharge status: Home / Self Care | Payer: Medicaid Other | Source: Ambulatory Visit | Attending: Internal Medicine | Admitting: Internal Medicine

## 2018-10-09 DIAGNOSIS — N644 Mastodynia: Secondary | ICD-10-CM

## 2018-11-15 ENCOUNTER — Emergency Department (HOSPITAL_COMMUNITY)
Admission: EM | Admit: 2018-11-15 | Discharge: 2018-11-15 | Disposition: A | Discharge status: Home / Self Care | Payer: Medicaid Other | Attending: Emergency Medicine | Admitting: Emergency Medicine

## 2018-11-15 ENCOUNTER — Other Ambulatory Visit: Payer: Self-pay

## 2018-11-15 ENCOUNTER — Encounter (HOSPITAL_COMMUNITY): Payer: Self-pay | Admitting: Emergency Medicine

## 2018-11-15 DIAGNOSIS — J45909 Unspecified asthma, uncomplicated: Secondary | ICD-10-CM | POA: Diagnosis not present

## 2018-11-15 DIAGNOSIS — M79671 Pain in right foot: Secondary | ICD-10-CM | POA: Diagnosis not present

## 2018-11-15 NOTE — Discharge Instructions (Addendum)
You were evaluated in the Emergency Department and after careful evaluation, we did not find any emergent condition requiring admission or further testing in the hospital.  Your symptoms today seem to be due to a sprain or strain of the right foot.  Your exam was otherwise reassuring.  We recommend a hard sole shoe to help with healing.  We recommend continue Tylenol or ibuprofen for discomfort and resting of the foot for the next 1 to 2 weeks.  If not improved in 1 to 2 weeks, you may need an x-ray.  Please return to the Emergency Department if you experience any worsening of your condition.  We encourage you to follow up with a primary care provider.  Thank you for allowing [place] to be a part of your care.

## 2018-11-15 NOTE — ED Provider Notes (Signed)
[facility] Provider Note MRN:  [id number]  Arrival date & time: 11/15/18     Chief Complaint   Foot Pain   History of Present Illness   Megan Cobb is a 28 y.o. year-old female with no pertinent past medical history presenting to the ED with chief complaint of pain.  1 or 2 days of right foot pain, started after working out.  Denies trauma.  Pain is constant, moderate in severity, worse with ambulating or palpation.  Denies any other symptoms.  Review of Systems  A problem-focused ROS was performed. Positive for foot pain.  Patient denies trauma.  Patient's Health History    Past Medical History:  Diagnosis Date  . Anemia   . Asthma     Past Surgical History:  Procedure Laterality Date  . NO PAST SURGERIES      Family History  Problem Relation Age of Onset  . Hypertension Mother   . Hypertension Father     Social History   Socioeconomic History  . Marital status: Single    Spouse name: Not on file  . Number of children: Not on file  . Years of education: Not on file  . Highest education level: Not on file  Occupational History  . Not on file  Social Needs  . Financial resource strain: Not on file  . Food insecurity    Worry: Not on file    Inability: Not on file  . Transportation needs    Medical: Not on file    Non-medical: Not on file  Tobacco Use  . Smoking status: Never Smoker  . Smokeless tobacco: Never Used  Substance and Sexual Activity  . Alcohol use: No  . Drug use: No  . Sexual activity: Not on file  Lifestyle  . Physical activity    Days per week: Not on file    Minutes per session: Not on file  . Stress: Not on file  Relationships  . Social Musicianconnections    Talks on phone: Not on file    Gets together: Not on file    Attends religious service: Not on file    Active member of club or organization: Not on file    Attends meetings of clubs or organizations: Not on file    Relationship status:  Not on file  . Intimate partner violence    Fear of current or ex partner: Not on file    Emotionally abused: Not on file    Physically abused: Not on file    Forced sexual activity: Not on file  Other Topics Concern  . Not on file  Social History Narrative  . Not on file     Physical Exam  Vital Signs and Nursing Notes reviewed Vitals:   11/15/18 1215  BP: 120/74  Pulse: 71  Resp: 18  Temp: 98.6 F (37 C)  SpO2: 100%    CONSTITUTIONAL: Well-appearing, NAD NEURO:  Alert and oriented x 3, no focal deficits EYES:  eyes equal and reactive ENT/NECK:  no LAD, no JVD CARDIO: Regular rate, well-perfused, normal S1 and S2 PULM:  CTAB no wheezing or rhonchi GI/GU:  normal bowel sounds, non-distended, non-tender MSK/SPINE:  No gross deformities, no edema; tenderness to palpation to the right second through fourth MTP joints, no erythema, no increased warmth, normal range of motion SKIN:  no rash, atraumatic PSYCH:  Appropriate speech and behavior  Diagnostic and Interventional Summary    EKG Interpretation  Date/Time:  Ventricular Rate:    PR Interval:    QRS Duration:   QT Interval:    QTC Calculation:   R Axis:     Text Interpretation:        Labs Reviewed - No data to display  No orders to display    Medications - No data to display   Procedures Critical Care  ED Course and Medical Decision Making  I have reviewed the triage vital signs and the nursing notes.  Pertinent labs & imaging results that were available during my care of the patient were reviewed by me and considered in my medical decision making (see below for details).  Suspect strain or sprain of the foot and is otherwise healthy 28 year old female, no trauma, no indication for imaging today, advised conservative management, advised reevaluation by healthcare provider in 1 or 2 weeks if not improved.  After the discussed management above, the patient was determined to be safe for discharge.  The  patient was in agreement with this plan and all questions regarding their care were answered.  ED return precautions were discussed and the patient will return to the ED with any significant worsening of condition.  Barth Kirks. Sedonia Small, [place] [email or web address]  Final Clinical Impressions(s) / ED Diagnoses     ICD-10-CM   1. Foot pain, right  M79.671     ED Discharge Orders    None         Maudie Flakes, MD 11/15/18 1319

## 2018-11-15 NOTE — ED Triage Notes (Signed)
Patient c/o right foot pain after "doing an exercise" x2 days ago.

## 2019-01-22 ENCOUNTER — Other Ambulatory Visit: Payer: Self-pay

## 2019-01-22 DIAGNOSIS — Z20822 Contact with and (suspected) exposure to covid-19: Secondary | ICD-10-CM

## 2019-01-24 LAB — NOVEL CORONAVIRUS, NAA: SARS-CoV-2, NAA: NOT DETECTED

## 2019-03-25 ENCOUNTER — Ambulatory Visit: Payer: Medicaid Other | Admitting: Podiatry

## 2019-03-28 ENCOUNTER — Ambulatory Visit (INDEPENDENT_AMBULATORY_CARE_PROVIDER_SITE_OTHER): Payer: 59 | Admitting: Podiatry

## 2019-03-28 ENCOUNTER — Other Ambulatory Visit: Payer: Self-pay

## 2019-03-28 ENCOUNTER — Encounter: Payer: Self-pay | Admitting: Podiatry

## 2019-03-28 ENCOUNTER — Other Ambulatory Visit: Payer: Self-pay | Admitting: Podiatry

## 2019-03-28 ENCOUNTER — Ambulatory Visit (INDEPENDENT_AMBULATORY_CARE_PROVIDER_SITE_OTHER): Payer: 59

## 2019-03-28 DIAGNOSIS — M7750 Other enthesopathy of unspecified foot: Secondary | ICD-10-CM

## 2019-03-28 DIAGNOSIS — M76821 Posterior tibial tendinitis, right leg: Secondary | ICD-10-CM

## 2019-03-28 DIAGNOSIS — M7752 Other enthesopathy of left foot: Secondary | ICD-10-CM | POA: Diagnosis not present

## 2019-03-28 MED ORDER — MELOXICAM 15 MG PO TABS: 15.0000 mg | ORAL_TABLET | Freq: Every day | ORAL | 2 refills | Status: DC

## 2019-03-29 NOTE — Progress Notes (Signed)
Subjective:   Patient ID: Megan Cobb, female   DOB: 28 y.o.   MRN: [id number]   HPI Patient states she is been having pain in her right ankle and it feels sore when she walks on it and she thinks she sprains but she does not have any specific injury.  She did go to the emergency room and had some anti-inflammatories but that was it and patient does not smoke likes to be active   Review of Systems  All other systems reviewed and are negative.       Objective:  Physical Exam Vitals and nursing note reviewed.  Constitutional:      Appearance: She is well-developed.  Pulmonary:     Effort: Pulmonary effort is normal.  Musculoskeletal:        General: Normal range of motion.  Skin:    General: Skin is warm.  Neurological:     Mental Status: She is alert.     Neurovascular status was found to be intact muscle strength was found to be adequate with inflammation pain of the sinus tarsi right with fluid buildup of the joint and capsular like irritation.  It is local to this area I did not note any excessive instability of the ankle ligaments     Assessment:  Inflammatory capsulitis right sinus tarsi which just may be due to foot structure     Plan:  H&P reviewed condition and today I did a careful injection of the capsule and into the peroneal tendon complex right to reduce inflammation.  If symptoms were to persist or get worse patient will be seen back  X-rays indicate that there does not appear to be a fracture or any diastases injury of the ankle or foot

## 2019-04-17 ENCOUNTER — Ambulatory Visit: Payer: BLUE CROSS/BLUE SHIELD | Attending: Internal Medicine

## 2019-04-17 DIAGNOSIS — Z20822 Contact with and (suspected) exposure to covid-19: Secondary | ICD-10-CM

## 2019-04-18 ENCOUNTER — Encounter: Payer: Self-pay | Admitting: *Deleted

## 2019-04-18 LAB — NOVEL CORONAVIRUS, NAA: SARS-CoV-2, NAA: DETECTED — AB

## 2019-06-19 ENCOUNTER — Ambulatory Visit: Payer: BLUE CROSS/BLUE SHIELD | Attending: Internal Medicine

## 2019-06-19 DIAGNOSIS — Z20822 Contact with and (suspected) exposure to covid-19: Secondary | ICD-10-CM

## 2019-06-20 LAB — NOVEL CORONAVIRUS, NAA: SARS-CoV-2, NAA: NOT DETECTED

## 2019-07-01 ENCOUNTER — Ambulatory Visit: Payer: BLUE CROSS/BLUE SHIELD | Attending: Internal Medicine

## 2019-07-01 DIAGNOSIS — Z20822 Contact with and (suspected) exposure to covid-19: Secondary | ICD-10-CM

## 2019-07-02 LAB — NOVEL CORONAVIRUS, NAA: SARS-CoV-2, NAA: NOT DETECTED

## 2020-05-03 ENCOUNTER — Emergency Department (HOSPITAL_COMMUNITY)
Admission: EM | Admit: 2020-05-03 | Discharge: 2020-05-03 | Disposition: A | Discharge status: Home / Self Care | Payer: Medicaid Other | Attending: Emergency Medicine | Admitting: Emergency Medicine

## 2020-05-03 ENCOUNTER — Encounter (HOSPITAL_COMMUNITY): Payer: Self-pay

## 2020-05-03 ENCOUNTER — Other Ambulatory Visit: Payer: Self-pay

## 2020-05-03 DIAGNOSIS — J029 Acute pharyngitis, unspecified: Secondary | ICD-10-CM | POA: Diagnosis not present

## 2020-05-03 DIAGNOSIS — J45909 Unspecified asthma, uncomplicated: Secondary | ICD-10-CM | POA: Diagnosis not present

## 2020-05-03 DIAGNOSIS — R070 Pain in throat: Secondary | ICD-10-CM | POA: Diagnosis present

## 2020-05-03 DIAGNOSIS — R030 Elevated blood-pressure reading, without diagnosis of hypertension: Secondary | ICD-10-CM | POA: Insufficient documentation

## 2020-05-03 MED ORDER — LIDOCAINE VISCOUS HCL 2 % MT SOLN
15.0000 mL | Freq: Once | OROMUCOSAL | Status: AC
  Administered 2020-05-03: 15 mL via OROMUCOSAL
  Filled 2020-05-03: qty 15

## 2020-05-03 MED ORDER — LIDOCAINE VISCOUS HCL 2 % MT SOLN: 15.0000 mL | OROMUCOSAL | 0 refills | Status: DC | PRN

## 2020-05-03 NOTE — ED Notes (Signed)
Patient refused vital signs at discharge.

## 2020-05-03 NOTE — ED Provider Notes (Signed)
[facility]-EMERGENCY DEPT Provider Note   CSN: [id number] Arrival date & time: 05/03/20  0134     History Chief Complaint  Patient presents with  . Sore Throat    Food stuck in throat    Megan Cobb is a 30 y.o. female.  The history is provided by the patient. No language interpreter was used.  Sore Throat     30 year old female presenting with concerns of food stuck in her throat.  Patient reports yesterday she was eating trail mix which includes granola, cashew, nuts and she felt as if it was stuck in the back of her throat.  She then try multiple different ways to help with her symptoms.  She tries swallowing banana, bread, and different types of liquid but states she is can still feel the irritation to the back of her throat.  Describes sensation as sharp, nonradiating, and uncomfortable.  She does not complain of any fever chills productive cough shortness of breath or wheezing.  Past Medical History:  Diagnosis Date  . Anemia   . Asthma     Patient Active Problem List   Diagnosis Date Noted  . PROM (premature rupture of membranes) 01/18/2016    Past Surgical History:  Procedure Laterality Date  . NO PAST SURGERIES       OB History    Gravida  1   Para  1   Term  1   Preterm      AB      Living  1     SAB      IAB      Ectopic      Multiple  0   Live Births  1           Family History  Problem Relation Age of Onset  . Hypertension Mother   . Hypertension Father     Social History   Tobacco Use  . Smoking status: Never Smoker  . Smokeless tobacco: Never Used  Vaping Use  . Vaping Use: Never used  Substance Use Topics  . Alcohol use: No  . Drug use: No    Home Medications Prior to Admission medications   Medication Sig Start Date End Date Taking? Authorizing Provider  acetaminophen (TYLENOL) 500 MG tablet Take 500-1,000 mg by mouth every 6 (six) hours as needed for mild pain, moderate pain or  headache.     [provider]  albuterol (PROVENTIL HFA;VENTOLIN HFA) 108 (90 Base) MCG/ACT inhaler Inhale 2 puffs into the lungs every 6 (six) hours as needed for wheezing or shortness of breath.    [provider]  amoxicillin (AMOXIL) 500 MG capsule Take 500 mg by mouth every 12 (twelve) hours. 03/18/19   [provider]  calcium carbonate (TUMS - DOSED IN MG ELEMENTAL CALCIUM) 500 MG chewable tablet Chew 2 tablets by mouth as needed for indigestion or heartburn.    [provider]  cetirizine (ZYRTEC) 10 MG tablet Take 10 mg by mouth daily as needed for allergies.    [provider]  diphenhydrAMINE (BENADRYL) 25 MG tablet Take 25 mg by mouth every 6 (six) hours as needed for itching, allergies or sleep.    [provider]  docusate sodium (COLACE) 100 MG capsule Take 1 capsule (100 mg total) by mouth 2 (two) times daily. 01/21/16   Myna Hidalgo, DO  ibuprofen (ADVIL,MOTRIN) 600 MG tablet Take 1 tablet (600 mg total) by mouth every 6 (six) hours. 01/21/16  Myna Hidalgo, DO  loratadine (CLARITIN) 10 MG tablet Take by mouth.    [provider]  meloxicam (MOBIC) 15 MG tablet Take 1 tablet (15 mg total) by mouth daily. 03/28/19   Lenn Sink, DPM  oxyCODONE-acetaminophen (PERCOCET/ROXICET) 5-325 MG tablet Take 1 tablet by mouth every 6 (six) hours as needed for moderate pain (pain scale 4-7). 01/21/16   Myna Hidalgo, DO  Prenatal Vit-Fe Fumarate-FA (MULTIVITAMIN-PRENATAL) 27-0.8 MG TABS tablet Take 1 tablet by mouth at bedtime.     [provider]    Allergies    Patient has no known allergies.  Review of Systems   Review of Systems  All other systems reviewed and are negative.   Physical Exam Updated Vital Signs BP 133/79 (BP Location: Right Arm)   Pulse 71   Temp 98.2 F (36.8 C) (Oral)   Resp 18   Ht 5' 3.5" (1.613 m)   Wt 78.5 kg   SpO2 96%   BMI 30.16 kg/m   Physical Exam Vitals and nursing note  reviewed.  Constitutional:      General: She is not in acute distress.    Appearance: She is well-developed and well-nourished.  HENT:     Head: Atraumatic.     Mouth/Throat:     Mouth: Mucous membranes are moist.     Pharynx: Uvula midline. No pharyngeal swelling or uvula swelling.     Tonsils: No tonsillar exudate or tonsillar abscesses. 1+ on the right. 1+ on the left.  Eyes:     Conjunctiva/sclera: Conjunctivae normal.  Cardiovascular:     Rate and Rhythm: Normal rate and regular rhythm.     Heart sounds: Normal heart sounds.  Pulmonary:     Effort: Pulmonary effort is normal.     Breath sounds: No wheezing, rhonchi or rales.  Abdominal:     General: Bowel sounds are normal.     Palpations: Abdomen is soft.     Tenderness: There is no abdominal tenderness.  Musculoskeletal:     Cervical back: Neck supple. No rigidity or tenderness.  Skin:    Findings: No rash.  Neurological:     Mental Status: She is alert and oriented to person, place, and time.  Psychiatric:        Mood and Affect: Mood and affect and mood normal.     ED Results / Procedures / Treatments   Labs (all labs ordered are listed, but only abnormal results are displayed) Labs Reviewed - No data to display  EKG None  Radiology No results found.  Procedures Procedures (including critical care time)  Medications Ordered in ED Medications  lidocaine (XYLOCAINE) 2 % viscous mouth solution 15 mL (15 mLs Mouth/Throat Given 05/03/20 0510)    ED Course  I have reviewed the triage vital signs and the nursing notes.  Pertinent labs & imaging results that were available during my care of the patient were reviewed by me and considered in my medical decision making (see chart for details).    MDM Rules/Calculators/A&P                          BP 133/79 (BP Location: Right Arm)   Pulse 71   Temp 98.2 F (36.8 C) (Oral)   Resp 18   Ht 5' 3.5" (1.613 m)   Wt 78.5 kg   SpO2 96%   BMI 30.16 kg/m    Final Clinical Impression(s) / ED Diagnoses Final diagnoses:  Sore  throat    Rx / DC Orders ED Discharge Orders         Ordered    lidocaine (XYLOCAINE) 2 % solution  As needed        05/03/20 0527         5:08 AM Patient complaining of sensation of food stuck in the back of her throat after she ate trail mix yesterday.  She is able to speak in complete sentences, able to tolerate p.o., no respiratory problem.  I suspect this is likely irritation of her esophagus and not a true food bolus.  Viscous lidocaine provided for comfort.  No evidence of strep strep infection or deep tissue infection on exam and per story.   Fayrene Helper, PA-C 05/03/20 0531    Nira Conn, MD 05/03/20 1754

## 2020-05-03 NOTE — Discharge Instructions (Signed)
Your discomfort is likely due to irritation from eating hard food.  This will likely improve for the next several days.  You may take viscous lidocaine swish and swallow for comfort.  If his symptoms persist after 3 to 5 days, call and follow-up closely with ENT specialist for further care.

## 2020-05-03 NOTE — ED Triage Notes (Signed)
Pt came in with c/o food stuck in her throat. States she was eating granola at approx 1800 yesterday, and she feels like something is stuck. Pt can drink water without difficulty, and she has no difficulty breathing. Pt states it "is just sitting on my throat". Describes it as sharp

## 2020-10-16 ENCOUNTER — Other Ambulatory Visit: Payer: Self-pay

## 2020-10-16 ENCOUNTER — Other Ambulatory Visit: Payer: Self-pay | Admitting: Internal Medicine

## 2020-10-16 ENCOUNTER — Ambulatory Visit
Admission: RE | Admit: 2020-10-16 | Discharge: 2020-10-16 | Disposition: A | Discharge status: Home / Self Care | Payer: BC Managed Care – PPO | Source: Ambulatory Visit | Attending: Internal Medicine | Admitting: Internal Medicine

## 2020-10-16 DIAGNOSIS — M25551 Pain in right hip: Secondary | ICD-10-CM

## 2021-03-15 IMAGING — CR RIGHT INDEX FINGER 2+V
3 series · 3 of 3 positions shown · non-contrast
Comparison: None.

CLINICAL DATA: Right finger laceration to the distal phalanx while
Nerban Aislant

EXAM:
RIGHT INDEX FINGER 2+V

[x finger pa right]
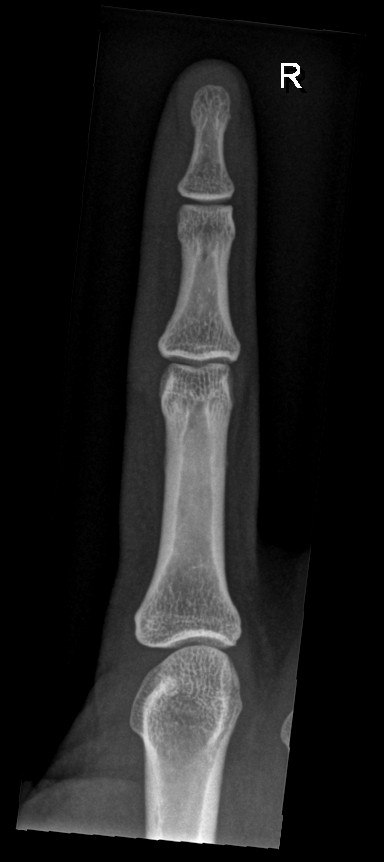

[x finger obl right]
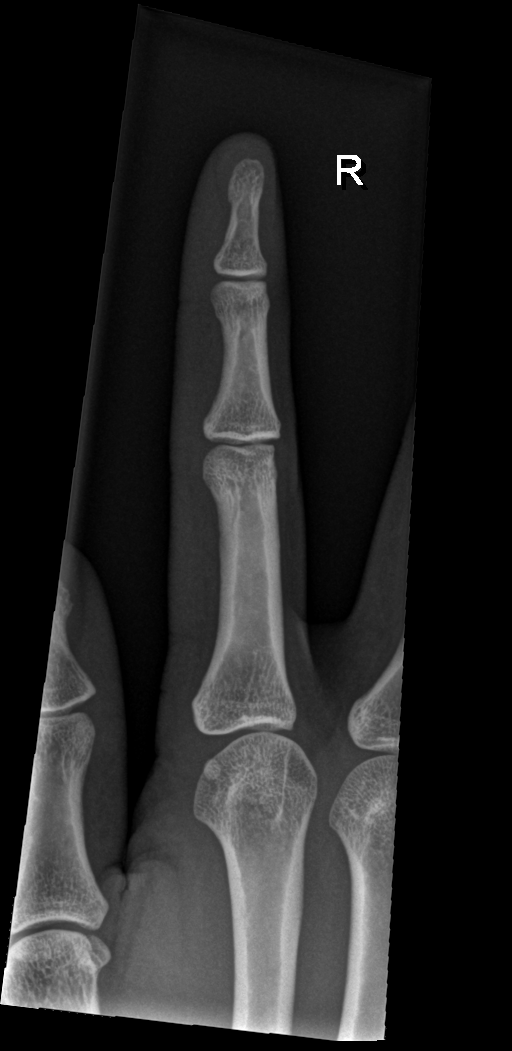

[x finger lat right]
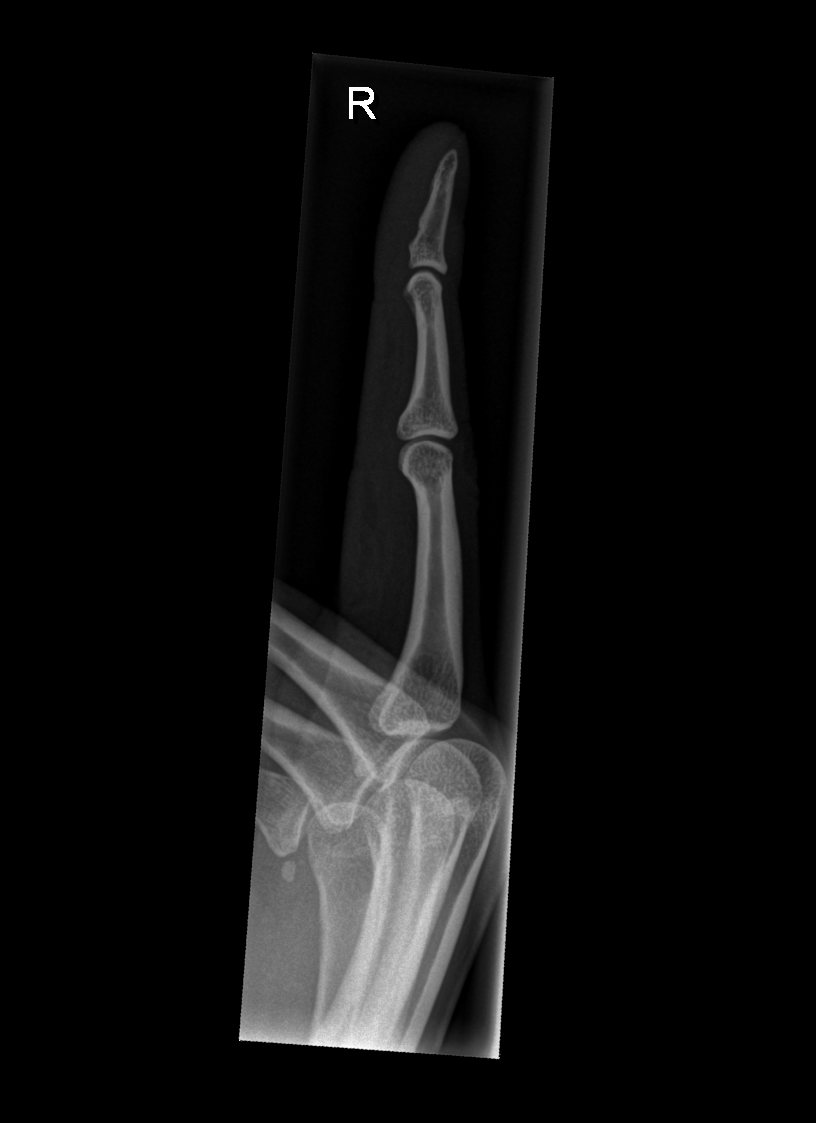

[3 of 3 positions shown; findings below may reference images not displayed]

FINDINGS: There is no evidence of fracture or dislocation. There is no
evidence of arthropathy or other focal bone abnormality. Soft
tissues are unremarkable.
IMPRESSION: Negative.

## 2021-05-21 ENCOUNTER — Other Ambulatory Visit: Payer: Self-pay | Admitting: Internal Medicine

## 2021-05-21 DIAGNOSIS — N6009 Solitary cyst of unspecified breast: Secondary | ICD-10-CM

## 2021-06-17 ENCOUNTER — Ambulatory Visit
Admission: RE | Admit: 2021-06-17 | Discharge: 2021-06-17 | Disposition: A | Discharge status: Home / Self Care | Payer: BC Managed Care – PPO | Source: Ambulatory Visit | Attending: Internal Medicine | Admitting: Internal Medicine

## 2021-06-17 ENCOUNTER — Other Ambulatory Visit: Payer: Self-pay

## 2021-06-17 DIAGNOSIS — N6009 Solitary cyst of unspecified breast: Secondary | ICD-10-CM

## 2022-04-18 NOTE — L&D Delivery Note (Signed)
Delivery Note At 2:53 AM a viable female was delivered via Vaginal, Spontaneous (Presentation: Right Occiput Anterior).  APGAR: 8, 9; weight  .   Placenta status: Spontaneous, Intact.  Cord: 3 vessels with the following complications: None.  Cord pH: NA  Anesthesia: Epidural Episiotomy: None Lacerations: Periurethral;1st degree;Perineal Suture Repair: 3.0 vicryl Est. Blood Loss (mL): 300  Mom to postpartum.  Baby to Couplet care / Skin to Skin.  Gerald Leitz 01/13/2023, 4:02 AM

## 2022-05-25 LAB — OB RESULTS CONSOLE ABO/RH: RH Type: POSITIVE

## 2022-05-25 LAB — OB RESULTS CONSOLE RPR: RPR: NONREACTIVE

## 2022-05-25 LAB — HEPATITIS C ANTIBODY: HCV Ab: NEGATIVE

## 2022-05-25 LAB — OB RESULTS CONSOLE RUBELLA ANTIBODY, IGM: Rubella: IMMUNE

## 2022-05-25 LAB — OB RESULTS CONSOLE HIV ANTIBODY (ROUTINE TESTING): HIV: NONREACTIVE

## 2022-05-25 LAB — OB RESULTS CONSOLE HEPATITIS B SURFACE ANTIGEN: Hepatitis B Surface Ag: NEGATIVE

## 2022-05-30 LAB — OB RESULTS CONSOLE GC/CHLAMYDIA
Chlamydia: NEGATIVE
Neisseria Gonorrhea: NEGATIVE

## 2022-09-23 LAB — OB RESULTS CONSOLE RPR: RPR: NONREACTIVE

## 2022-12-03 LAB — OB RESULTS CONSOLE GBS: GBS: NEGATIVE

## 2023-01-02 ENCOUNTER — Inpatient Hospital Stay (HOSPITAL_COMMUNITY)
Admission: AD | Admit: 2023-01-02 | Discharge: 2023-01-03 | Disposition: A | Discharge status: Home / Self Care | Payer: BC Managed Care – PPO | Attending: Obstetrics and Gynecology | Admitting: Obstetrics and Gynecology

## 2023-01-02 ENCOUNTER — Encounter (HOSPITAL_COMMUNITY): Payer: Self-pay | Admitting: *Deleted

## 2023-01-02 DIAGNOSIS — O479 False labor, unspecified: Secondary | ICD-10-CM

## 2023-01-02 DIAGNOSIS — O471 False labor at or after 37 completed weeks of gestation: Secondary | ICD-10-CM | POA: Insufficient documentation

## 2023-01-02 DIAGNOSIS — Z3A39 39 weeks gestation of pregnancy: Secondary | ICD-10-CM

## 2023-01-02 NOTE — MAU Note (Signed)
Pt says last few nights- baby active and pushing down.  Unsure if liquid is coming from vagina  No wetness now  [facility]- Dr Connye Burkitt Denies HSV GBS- neg  Feels occ - mild  VE last Friday- FT

## 2023-01-03 ENCOUNTER — Telehealth (HOSPITAL_COMMUNITY): Payer: Self-pay | Admitting: *Deleted

## 2023-01-03 ENCOUNTER — Encounter (HOSPITAL_COMMUNITY): Payer: Self-pay | Admitting: Obstetrics and Gynecology

## 2023-01-03 DIAGNOSIS — O479 False labor, unspecified: Secondary | ICD-10-CM

## 2023-01-03 DIAGNOSIS — Z3A39 39 weeks gestation of pregnancy: Secondary | ICD-10-CM

## 2023-01-03 DIAGNOSIS — O471 False labor at or after 37 completed weeks of gestation: Secondary | ICD-10-CM | POA: Diagnosis present

## 2023-01-03 LAB — POCT FERN TEST: POCT Fern Test: NEGATIVE

## 2023-01-03 NOTE — Telephone Encounter (Signed)
Preadmission screen  

## 2023-01-03 NOTE — MAU Provider Note (Signed)
Event Date/Time   First Provider Initiated Contact with Patient 01/03/23 0118      S Ms. Megan Cobb is a 32 y.o. G53P1001 pregnant female at [redacted]w[redacted]d who presents to MAU today with complaint of LOF and subjective decreased fetal movement.  Denies VB.  Reports braxton hick contractions.    Receives care at [facility]. Prenatal records reviewed.  Pertinent items noted in HPI and remainder of comprehensive ROS otherwise negative.   O BP 130/65   Pulse (!) 104   Temp 98.3 F (36.8 C) (Oral)   Resp 14   Ht 5\' 3"  (1.6 m)   Wt 107.1 kg   SpO2 98%   BMI 41.84 kg/m  Physical Exam   NST: 120bpm, moderate variability, +accels, no decels, no contractions on toco  MDM: low MAU Course:  No pooling, negative fern.  Patient also now report +FM.  Cat 1 NST w/o ctx noted.  False labor.   A&P: #39weeks #R/o rupture, false labor - stable for discharge  Discharge from MAU in stable condition with strict, usual precautions Follow up at [facility] as scheduled for ongoing prenatal care  Allergies as of 01/03/2023       Reactions   Meloxicam Hives   Methocarbamol Rash        Medication List     TAKE these medications    acetaminophen 500 MG tablet Commonly known as: TYLENOL Take 500-1,000 mg by mouth every 6 (six) hours as needed for mild pain, moderate pain or headache.   albuterol 108 (90 Base) MCG/ACT inhaler Commonly known as: VENTOLIN HFA Inhale 2 puffs into the lungs every 6 (six) hours as needed for wheezing or shortness of breath.   amoxicillin 500 MG capsule Commonly known as: AMOXIL Take 500 mg by mouth every 12 (twelve) hours.   calcium carbonate 500 MG chewable tablet Commonly known as: TUMS - dosed in mg elemental calcium Chew 2 tablets by mouth as needed for indigestion or heartburn.   cetirizine 10 MG tablet Commonly known as: ZYRTEC Take 10 mg by mouth daily as needed for allergies.   diphenhydrAMINE 25 MG tablet Commonly known as: BENADRYL Take 25  mg by mouth every 6 (six) hours as needed for itching, allergies or sleep.   docusate sodium 100 MG capsule Commonly known as: Colace Take 1 capsule (100 mg total) by mouth 2 (two) times daily.   ibuprofen 600 MG tablet Commonly known as: ADVIL Take 1 tablet (600 mg total) by mouth every 6 (six) hours.   lidocaine 2 % solution Commonly known as: XYLOCAINE Use as directed 15 mLs in the mouth or throat as needed for mouth pain.   loratadine 10 MG tablet Commonly known as: CLARITIN Take by mouth.   meloxicam 15 MG tablet Commonly known as: MOBIC Take 1 tablet (15 mg total) by mouth daily.   multivitamin-prenatal 27-0.8 MG Tabs tablet Take 1 tablet by mouth at bedtime.   oxyCODONE-acetaminophen 5-325 MG tablet Commonly known as: PERCOCET/ROXICET Take 1 tablet by mouth every 6 (six) hours as needed for moderate pain (pain scale 4-7).        Hessie Dibble, MD 01/03/2023 2:25 AM

## 2023-01-05 NOTE — H&P (Signed)
HPI: 32 y.o. G2P1001 @ [redacted]w[redacted]d estimated gestational age (as dated by LMP c/w 8 week ultrasound) presents for induction of labor at late term gestation.  Leakage of fluid:  No Vaginal bleeding:  No Contractions:  No Fetal movement:  Yes  Prenatal care has been provided by Dr. Katrinka Blazing. Connye Burkitt [facility]).  ROS:  Denies fevers, chills, chest pain, visual changes, SOB, RUQ/epigastric pain, N/V, dysuria, hematuria, or sudden onset/worsening bilateral LE or facial edema.  Pregnancy complicated by: Size greater than dates (normal growth [place]) Asthma (mild, intermittent, uses Albuterol PRN) UTI x 1 this pregnancy (s/p treatment)  Prenatal Transfer Tool  Maternal Diabetes: No Genetic Screening: Declined Maternal Ultrasounds/Referrals: Normal Fetal Ultrasounds or other Referrals:  None Maternal Substance Abuse:  No Significant Maternal Medications:  None Significant Maternal Lab Results: Group B Strep negative   Prenatal Labs Blood type:  A Positive Antibody screen:  Negative CBC:  H/H 11.3/34.7 Rubella: Immune RPR:  Non-reactive Hep B:  Negative Hep C:  Negative HIV:  Negative GC/CT:  Negative Glucola:  90.7 (WNL)  Immunizations: Tdap: Given prenatally Flu: Has not had   Contraceptive plan: Condoms  OBHx:  OB History     Gravida  2   Para  1   Term  1   Preterm      AB      Living  1      SAB      IAB      Ectopic      Multiple  0   Live Births  1          PMHx:  See above Meds:  PNV Allergy:   Allergies  Allergen Reactions   Meloxicam Hives   Methocarbamol Rash   SurgHx:  Past Surgical History:  Procedure Laterality Date   NO PAST SURGERIES     SocHx:   Denies Tobacco, ETOH, illicit drugs  O: There were no vitals taken for this visit. Gen. AAOx3, NAD CV.  RRR  Resp. CTAB, no wheezes/rales/rhonchi Abd. Gravid, soft, non-tender throughout, no rebound/guarding Extr.  Trace bilateral LE edema, no calf tenderness bilaterally SVE  (12/30/22): 0.5/thick/high   Last [place] (9/9):  [redacted]w[redacted]d, EFW 3433g, 7lb 9oz (58%), AAFV, cephalic, right lateral placenta  Labs: see orders  A/P:  32 y.o. G2P1001 @ [redacted]w[redacted]d who is admitted for induction of labor at late term gestation.  - Admit to L&D - Admit labs (CBC, T&S, RPR) - CEFM/Toco - Diet:  Clear liquids - IVF:  LR at 125cc/hour - VTE Prophylaxis:  SCDs - GBS Status:  Negative - Presentation:  Confirm prior to IOL - Pain control:  Per patient request - Induction method:  Cytotec for cervical ripening  - Anticipate SVD  Steva Ready, DO

## 2023-01-09 ENCOUNTER — Encounter (HOSPITAL_COMMUNITY): Payer: Self-pay | Admitting: *Deleted

## 2023-01-12 ENCOUNTER — Inpatient Hospital Stay (HOSPITAL_COMMUNITY)
Admission: AD | Admit: 2023-01-12 | Discharge: 2023-01-15 | DRG: 806 | Disposition: A | Discharge status: Home / Self Care | Payer: BC Managed Care – PPO | Attending: Obstetrics and Gynecology | Admitting: Obstetrics and Gynecology

## 2023-01-12 ENCOUNTER — Other Ambulatory Visit: Payer: Self-pay

## 2023-01-12 ENCOUNTER — Inpatient Hospital Stay (HOSPITAL_COMMUNITY): Payer: BC Managed Care – PPO | Admitting: Anesthesiology

## 2023-01-12 ENCOUNTER — Encounter (HOSPITAL_COMMUNITY): Payer: Self-pay | Admitting: Obstetrics and Gynecology

## 2023-01-12 ENCOUNTER — Inpatient Hospital Stay (HOSPITAL_COMMUNITY): Payer: BC Managed Care – PPO

## 2023-01-12 DIAGNOSIS — D62 Acute posthemorrhagic anemia: Secondary | ICD-10-CM | POA: Diagnosis not present

## 2023-01-12 DIAGNOSIS — O9081 Anemia of the puerperium: Secondary | ICD-10-CM | POA: Diagnosis not present

## 2023-01-12 DIAGNOSIS — O9952 Diseases of the respiratory system complicating childbirth: Secondary | ICD-10-CM | POA: Diagnosis present

## 2023-01-12 DIAGNOSIS — J45909 Unspecified asthma, uncomplicated: Secondary | ICD-10-CM | POA: Diagnosis present

## 2023-01-12 DIAGNOSIS — Z889 Allergy status to unspecified drugs, medicaments and biological substances status: Secondary | ICD-10-CM

## 2023-01-12 DIAGNOSIS — O480 Post-term pregnancy: Secondary | ICD-10-CM | POA: Diagnosis present

## 2023-01-12 DIAGNOSIS — Z3490 Encounter for supervision of normal pregnancy, unspecified, unspecified trimester: Principal | ICD-10-CM | POA: Diagnosis present

## 2023-01-12 DIAGNOSIS — Z3A41 41 weeks gestation of pregnancy: Secondary | ICD-10-CM

## 2023-01-12 DIAGNOSIS — Z886 Allergy status to analgesic agent status: Secondary | ICD-10-CM | POA: Diagnosis not present

## 2023-01-12 LAB — RPR: RPR Ser Ql: NONREACTIVE

## 2023-01-12 LAB — CBC
HCT: 35.4 % — ABNORMAL LOW (ref 36.0–46.0)
Hemoglobin: 11 g/dL — ABNORMAL LOW (ref 12.0–15.0)
MCH: 24.4 pg — ABNORMAL LOW (ref 26.0–34.0)
MCHC: 31.1 g/dL (ref 30.0–36.0)
MCV: 78.5 fL — ABNORMAL LOW (ref 80.0–100.0)
Platelets: 168 10*3/uL (ref 150–400)
RBC: 4.51 MIL/uL (ref 3.87–5.11)
RDW: 15.2 % (ref 11.5–15.5)
WBC: 9 10*3/uL (ref 4.0–10.5)
nRBC: 0 % (ref 0.0–0.2)

## 2023-01-12 LAB — TYPE AND SCREEN
ABO/RH(D): A POS
Antibody Screen: NEGATIVE

## 2023-01-12 MED ORDER — DIPHENHYDRAMINE HCL 50 MG/ML IJ SOLN: 12.5000 mg | INTRAMUSCULAR | Status: DC | PRN

## 2023-01-12 MED ORDER — LACTATED RINGERS IV SOLN: INTRAVENOUS | Status: DC

## 2023-01-12 MED ORDER — LACTATED RINGERS IV SOLN
500.0000 mL | Freq: Once | INTRAVENOUS | Status: AC
  Administered 2023-01-12: 500 mL via INTRAVENOUS

## 2023-01-12 MED ORDER — FENTANYL CITRATE (PF) 100 MCG/2ML IJ SOLN: 50.0000 ug | INTRAMUSCULAR | Status: DC | PRN

## 2023-01-12 MED ORDER — OXYTOCIN-SODIUM CHLORIDE 30-0.9 UT/500ML-% IV SOLN: 2.5000 [IU]/h | INTRAVENOUS | Status: DC

## 2023-01-12 MED ORDER — LIDOCAINE HCL (PF) 1 % IJ SOLN: 30.0000 mL | INTRAMUSCULAR | Status: DC | PRN

## 2023-01-12 MED ORDER — LIDOCAINE HCL (PF) 1 % IJ SOLN
INTRAMUSCULAR | Status: DC | PRN
  Administered 2023-01-12: 5 mL via EPIDURAL

## 2023-01-12 MED ORDER — FENTANYL-BUPIVACAINE-NACL 0.5-0.125-0.9 MG/250ML-% EP SOLN
EPIDURAL | Status: DC | PRN
  Administered 2023-01-12: 12 mL/h via EPIDURAL

## 2023-01-12 MED ORDER — OXYTOCIN BOLUS FROM INFUSION
333.0000 mL | Freq: Once | INTRAVENOUS | Status: AC
  Administered 2023-01-13: 333 mL via INTRAVENOUS

## 2023-01-12 MED ORDER — PHENYLEPHRINE 80 MCG/ML (10ML) SYRINGE FOR IV PUSH (FOR BLOOD PRESSURE SUPPORT): 80.0000 ug | PREFILLED_SYRINGE | INTRAVENOUS | Status: DC | PRN

## 2023-01-12 MED ORDER — MISOPROSTOL 25 MCG QUARTER TABLET: 25.0000 ug | ORAL_TABLET | ORAL | Status: DC | PRN

## 2023-01-12 MED ORDER — OXYCODONE-ACETAMINOPHEN 5-325 MG PO TABS: 2.0000 | ORAL_TABLET | ORAL | Status: DC | PRN

## 2023-01-12 MED ORDER — OXYCODONE-ACETAMINOPHEN 5-325 MG PO TABS: 1.0000 | ORAL_TABLET | ORAL | Status: DC | PRN

## 2023-01-12 MED ORDER — FENTANYL-BUPIVACAINE-NACL 0.5-0.125-0.9 MG/250ML-% EP SOLN
12.0000 mL/h | EPIDURAL | Status: DC | PRN
  Filled 2023-01-12: qty 250

## 2023-01-12 MED ORDER — OXYTOCIN-SODIUM CHLORIDE 30-0.9 UT/500ML-% IV SOLN
1.0000 m[IU]/min | INTRAVENOUS | Status: DC
  Filled 2023-01-12: qty 500

## 2023-01-12 MED ORDER — TERBUTALINE SULFATE 1 MG/ML IJ SOLN
0.2500 mg | Freq: Once | INTRAMUSCULAR | Status: AC | PRN
  Administered 2023-01-12: 0.25 mg via SUBCUTANEOUS
  Filled 2023-01-12: qty 1

## 2023-01-12 MED ORDER — SOD CITRATE-CITRIC ACID 500-334 MG/5ML PO SOLN: 30.0000 mL | ORAL | Status: DC | PRN

## 2023-01-12 MED ORDER — ACETAMINOPHEN 325 MG PO TABS: 650.0000 mg | ORAL_TABLET | ORAL | Status: DC | PRN

## 2023-01-12 MED ORDER — MISOPROSTOL 25 MCG QUARTER TABLET
25.0000 ug | ORAL_TABLET | ORAL | Status: DC | PRN
  Administered 2023-01-12 (×2): 25 ug via VAGINAL
  Filled 2023-01-12 (×2): qty 1

## 2023-01-12 MED ORDER — LACTATED RINGERS IV SOLN
500.0000 mL | INTRAVENOUS | Status: DC | PRN
  Administered 2023-01-12: 500 mL via INTRAVENOUS

## 2023-01-12 MED ORDER — EPHEDRINE 5 MG/ML INJ: 10.0000 mg | INTRAVENOUS | Status: DC | PRN

## 2023-01-12 MED ORDER — HYDROXYZINE HCL 50 MG PO TABS: 50.0000 mg | ORAL_TABLET | Freq: Four times a day (QID) | ORAL | Status: DC | PRN

## 2023-01-12 NOTE — Progress Notes (Signed)
Megan Cobb is a 32 y.o. G2P1001 at [redacted]w[redacted]d by LMP admitted for induction of labor due to Post dates. Due date 01/04/2023.  Subjective: Patient reports mild contractions. +FM no lof no vaginal bleeding.    Objective: BP 106/71   Pulse 92   Temp 98.3 F (36.8 C) (Oral)   Resp 16   Ht 5\' 3"  (1.6 m)   Wt 108.5 kg   BMI 42.37 kg/m  No intake/output data recorded. No intake/output data recorded.  FHT:  FHR: 130 bpm, variability: moderate,  accelerations:  Present,  decelerations:  Absent UC:   Irregular SVE:   Dilation: 1 Exam by:: Raliegh Ip RN  Labs: Lab Results  Component Value Date   WBC 9.0 01/12/2023   HGB 11.0 (L) 01/12/2023   HCT 35.4 (L) 01/12/2023   MCV 78.5 (L) 01/12/2023   PLT 168 01/12/2023    Assessment / Plan: Induction of labor due to postdates  Labor:  Continue cytotec for cervical ripening Preeclampsia:   NA Fetal Wellbeing:  Category I Pain Control:   per patient preference I/D:  n/a Anticipated MOD:  NSVD  Gerald Leitz, MD 01/12/2023, 5:32 PM

## 2023-01-12 NOTE — Anesthesia Procedure Notes (Signed)
Epidural Patient location during procedure: OB Start time: 01/12/2023 10:03 PM End time: 01/12/2023 10:18 PM  Staffing Anesthesiologist: Trevor Iha, MD Performed: anesthesiologist   Preanesthetic Checklist Completed: patient identified, IV checked, site marked, risks and benefits discussed, surgical consent, monitors and equipment checked, pre-op evaluation and timeout performed  Epidural Patient position: sitting Prep: DuraPrep and site prepped and draped Patient monitoring: continuous pulse ox and blood pressure Approach: midline Location: L3-L4 Injection technique: LOR air  Needle:  Needle type: Tuohy  Needle gauge: 17 G Needle length: 9 cm and 9 Needle insertion depth: 8 cm Catheter type: closed end flexible Catheter size: 19 Gauge Catheter at skin depth: 14 cm Test dose: negative  Assessment Events: blood not aspirated, no cerebrospinal fluid, injection not painful, no injection resistance, no paresthesia and negative IV test  Additional Notes Patient identified. Risks/Benefits/Options discussed with patient including but not limited to bleeding, infection, nerve damage, paralysis, failed block, incomplete pain control, headache, blood pressure changes, nausea, vomiting, reactions to medication both or allergic, itching and postpartum back pain. Confirmed with bedside nurse the patient's most recent platelet count. Confirmed with patient that they are not currently taking any anticoagulation, have any bleeding history or any family history of bleeding disorders. Patient expressed understanding and wished to proceed. All questions were answered. Sterile technique was used throughout the entire procedure. Please see nursing notes for vital signs. Test dose was given through epidural needle and negative prior to continuing to dose epidural or start infusion. Warning signs of high block given to the patient including shortness of breath, tingling/numbness in hands, complete  motor block, or any concerning symptoms with instructions to call for help. Patient was given instructions on fall risk and not to get out of bed. All questions and concerns addressed with instructions to call with any issues.2  Attempt (S) . Patient tolerated procedure well.

## 2023-01-12 NOTE — Progress Notes (Addendum)
  Subjective: In to assess patient due to variable decelerations and prolonged decelerations after epidural. Patient received terbutaline with improvement in fetal heart rate. SROM occurred at 1915.   Objective: BP 135/61   Pulse (!) 101   Temp 97.9 F (36.6 C) (Oral)   Resp 17   Ht 5\' 3"  (1.6 m)   Wt 108.5 kg   SpO2 100%   BMI 42.37 kg/m  No intake/output data recorded. No intake/output data recorded.  FHT:  FHR: 140 bpm, variability: moderate,  accelerations:  Present,  decelerations:  Present variable UC:   regular, every 4 minutes SVE:   Dilation: 4.5 Effacement (%): 70 Station: -1, -2 Exam by:: Karl Ito, rnc  Labs: Lab Results  Component Value Date   WBC 9.0 01/12/2023   HGB 11.0 (L) 01/12/2023   HCT 35.4 (L) 01/12/2023   MCV 78.5 (L) 01/12/2023   PLT 168 01/12/2023    Assessment / Plan: Induction of labor due to postdates.   Labor:  Progressing s/p  2 doses of cytotec. In active labor after SROM at 1915.  Preeclampsia:   NA Fetal Wellbeing:  Category I and Category II Pain Control:  Epidural I/D:  n/a Anticipated MOD:  NSVD  Gerald Leitz, MD 01/12/2023, 11:37 PM  IUPC placed without difficulty will start Amnioinfusion 300 cc bolus and run at 100 cc an hour.

## 2023-01-12 NOTE — Anesthesia Preprocedure Evaluation (Signed)
Anesthesia Evaluation  Patient identified by MRN, date of birth, ID band Patient awake    Reviewed: Allergy & Precautions, NPO status , Patient's Chart, lab work & pertinent test results  Airway Mallampati: II  TM Distance: >3 FB Neck ROM: Full    Dental no notable dental hx. (+) Teeth Intact, Dental Advisory Given   Pulmonary asthma    Pulmonary exam normal breath sounds clear to auscultation       Cardiovascular negative cardio ROS Normal cardiovascular exam Rhythm:Regular Rate:Normal     Neuro/Psych negative neurological ROS     GI/Hepatic negative GI ROS, Neg liver ROS,,,  Endo/Other  negative endocrine ROS    Renal/GU negative Renal ROS     Musculoskeletal   Abdominal  (+) + obese (BMI 42.4)  Peds  Hematology Lab Results      Component                Value               Date                      WBC                      9.0                 01/12/2023                HGB                      11.0 (L)            01/12/2023                HCT                      35.4 (L)            01/12/2023                MCV                      78.5 (L)            01/12/2023                PLT                      168                 01/12/2023              Anesthesia Other Findings All: Meloxicam, Methocarbamol  Reproductive/Obstetrics (+) Pregnancy                             Anesthesia Physical Anesthesia Plan  ASA: 3  Anesthesia Plan: Epidural   Post-op Pain Management:    Induction:   PONV Risk Score and Plan:   Airway Management Planned:   Additional Equipment:   Intra-op Plan:   Post-operative Plan:   Informed Consent: I have reviewed the patients History and Physical, chart, labs and discussed the procedure including the risks, benefits and alternatives for the proposed anesthesia with the patient or authorized representative who has indicated his/her understanding and  acceptance.       Plan Discussed with:   Anesthesia Plan Comments: (41 wk G2P1  w BMI 42.2 for LEA)       Anesthesia Quick Evaluation

## 2023-01-13 LAB — CBC
HCT: 30.3 % — ABNORMAL LOW (ref 36.0–46.0)
Hemoglobin: 9.5 g/dL — ABNORMAL LOW (ref 12.0–15.0)
MCH: 24.4 pg — ABNORMAL LOW (ref 26.0–34.0)
MCHC: 31.4 g/dL (ref 30.0–36.0)
MCV: 77.9 fL — ABNORMAL LOW (ref 80.0–100.0)
Platelets: 155 10*3/uL (ref 150–400)
RBC: 3.89 MIL/uL (ref 3.87–5.11)
RDW: 15 % (ref 11.5–15.5)
WBC: 15.3 10*3/uL — ABNORMAL HIGH (ref 4.0–10.5)
nRBC: 0 % (ref 0.0–0.2)

## 2023-01-13 MED ORDER — OXYTOCIN-SODIUM CHLORIDE 30-0.9 UT/500ML-% IV SOLN
INTRAVENOUS | Status: AC
  Filled 2023-01-13: qty 500

## 2023-01-13 MED ORDER — ONDANSETRON HCL 4 MG/2ML IJ SOLN: 4.0000 mg | INTRAMUSCULAR | Status: DC | PRN

## 2023-01-13 MED ORDER — BUPIVACAINE LIPOSOME 1.3 % IJ SUSP
INTRAMUSCULAR | Status: AC
  Filled 2023-01-13: qty 20

## 2023-01-13 MED ORDER — ONDANSETRON HCL 4 MG PO TABS: 4.0000 mg | ORAL_TABLET | ORAL | Status: DC | PRN

## 2023-01-13 MED ORDER — DIBUCAINE (PERIANAL) 1 % EX OINT: 1.0000 | TOPICAL_OINTMENT | CUTANEOUS | Status: DC | PRN

## 2023-01-13 MED ORDER — METHYLERGONOVINE MALEATE 0.2 MG/ML IJ SOLN: 0.2000 mg | INTRAMUSCULAR | Status: DC | PRN

## 2023-01-13 MED ORDER — DIPHENHYDRAMINE HCL 25 MG PO CAPS: 25.0000 mg | ORAL_CAPSULE | Freq: Four times a day (QID) | ORAL | Status: DC | PRN

## 2023-01-13 MED ORDER — LACTATED RINGERS AMNIOINFUSION: INTRAVENOUS | Status: DC

## 2023-01-13 MED ORDER — ACETAMINOPHEN 325 MG PO TABS
650.0000 mg | ORAL_TABLET | ORAL | Status: DC | PRN
  Administered 2023-01-13 – 2023-01-15 (×10): 650 mg via ORAL
  Filled 2023-01-13 (×10): qty 2

## 2023-01-13 MED ORDER — COCONUT OIL OIL: 1.0000 | TOPICAL_OIL | Status: DC | PRN

## 2023-01-13 MED ORDER — BENZOCAINE-MENTHOL 20-0.5 % EX AERO
1.0000 | INHALATION_SPRAY | CUTANEOUS | Status: DC | PRN
  Administered 2023-01-13: 1 via TOPICAL
  Filled 2023-01-13: qty 56

## 2023-01-13 MED ORDER — ZOLPIDEM TARTRATE 5 MG PO TABS: 5.0000 mg | ORAL_TABLET | Freq: Every evening | ORAL | Status: DC | PRN

## 2023-01-13 MED ORDER — FENTANYL CITRATE (PF) 100 MCG/2ML IJ SOLN
INTRAMUSCULAR | Status: AC
  Filled 2023-01-13: qty 2

## 2023-01-13 MED ORDER — WITCH HAZEL-GLYCERIN EX PADS: 1.0000 | MEDICATED_PAD | CUTANEOUS | Status: DC | PRN

## 2023-01-13 MED ORDER — SIMETHICONE 80 MG PO CHEW: 80.0000 mg | CHEWABLE_TABLET | ORAL | Status: DC | PRN

## 2023-01-13 MED ORDER — PRENATAL MULTIVITAMIN CH
1.0000 | ORAL_TABLET | Freq: Every day | ORAL | Status: DC
  Administered 2023-01-13: 1 via ORAL
  Filled 2023-01-13 (×3): qty 1

## 2023-01-13 MED ORDER — ONDANSETRON HCL 4 MG/2ML IJ SOLN
INTRAMUSCULAR | Status: AC
  Filled 2023-01-13: qty 2

## 2023-01-13 MED ORDER — BUPIVACAINE HCL (PF) 0.5 % IJ SOLN
INTRAMUSCULAR | Status: AC
  Filled 2023-01-13: qty 30

## 2023-01-13 MED ORDER — ACETAMINOPHEN 10 MG/ML IV SOLN
INTRAVENOUS | Status: AC
  Filled 2023-01-13: qty 100

## 2023-01-13 MED ORDER — PHENYLEPHRINE 80 MCG/ML (10ML) SYRINGE FOR IV PUSH (FOR BLOOD PRESSURE SUPPORT)
PREFILLED_SYRINGE | INTRAVENOUS | Status: AC
  Filled 2023-01-13: qty 10

## 2023-01-13 MED ORDER — FERROUS SULFATE 325 (65 FE) MG PO TABS
325.0000 mg | ORAL_TABLET | Freq: Two times a day (BID) | ORAL | Status: DC
  Administered 2023-01-13 – 2023-01-14 (×4): 325 mg via ORAL
  Filled 2023-01-13 (×4): qty 1

## 2023-01-13 MED ORDER — DEXAMETHASONE SODIUM PHOSPHATE 4 MG/ML IJ SOLN
INTRAMUSCULAR | Status: AC
  Filled 2023-01-13: qty 2

## 2023-01-13 MED ORDER — IBUPROFEN 600 MG PO TABS
600.0000 mg | ORAL_TABLET | Freq: Four times a day (QID) | ORAL | Status: DC
  Administered 2023-01-13 (×3): 600 mg via ORAL
  Filled 2023-01-13 (×4): qty 1

## 2023-01-13 MED ORDER — SENNOSIDES-DOCUSATE SODIUM 8.6-50 MG PO TABS
2.0000 | ORAL_TABLET | Freq: Every day | ORAL | Status: DC
  Administered 2023-01-13 – 2023-01-15 (×3): 2 via ORAL
  Filled 2023-01-13 (×2): qty 2

## 2023-01-13 MED ORDER — METHYLERGONOVINE MALEATE 0.2 MG PO TABS: 0.2000 mg | ORAL_TABLET | ORAL | Status: DC | PRN

## 2023-01-13 MED ORDER — MORPHINE SULFATE (PF) 0.5 MG/ML IJ SOLN
INTRAMUSCULAR | Status: AC
  Filled 2023-01-13: qty 10

## 2023-01-13 NOTE — Progress Notes (Signed)
  Subjective: In to assess patient due to variable and prolonged decelerations. Upon my arrival heart rate had returned to baseline of 140's   Objective: BP 136/66   Pulse 97   Temp 97.9 F (36.6 C) (Oral)   Resp 17   Ht 5\' 3"  (1.6 m)   Wt 108.5 kg   SpO2 100%   BMI 42.37 kg/m  No intake/output data recorded. No intake/output data recorded.  FHT:  FHR: 140 bpm, variability: moderate,  accelerations:  Present,  decelerations:  Present variable UC:   regular, every 2-3 minutes SVE:   Dilation: 8 Effacement (%): 90 Station: 0 Exam by:: Dr.  Richardson Dopp  Labs: Lab Results  Component Value Date   WBC 9.0 01/12/2023   HGB 11.0 (L) 01/12/2023   HCT 35.4 (L) 01/12/2023   MCV 78.5 (L) 01/12/2023   PLT 168 01/12/2023    Assessment / Plan: Induction of labor due to postdates  Labor: Progressing normally Preeclampsia:   N A Fetal Wellbeing:  Category II and current category 1 discussed with patient cesarean section in the event fhr exhibits recurrent prolonged decelerations again. R/b/a discussed including but not limited to infection , bleeding, damage to bowel bladder baby with the need for further surgery.  Pain Control:  Epidural I/D:  n/a Anticipated MOD:   undetermined will monitor closely   Gerald Leitz, MD 01/13/2023, 12:26 AM

## 2023-01-13 NOTE — Progress Notes (Signed)
Postpartum Note Day #0  S:  Patient doing well.  Pain controlled.  Tolerating regular diet. Denies fevers, chills, chest pain, SOB, N/V, or worsening bilateral LE edema.  Lochia: Minimal Infant feeding:  Breast Circumcision:  Desires prior to discharge Contraception:  Condoms  O: Temp:  [97.6 F (36.4 C)-99.6 F (37.6 C)] 99.3 F (37.4 C) (09/27 0555) Pulse Rate:  [78-117] 78 (09/27 0706) Resp:  [16-18] 17 (09/27 0555) BP: (97-144)/(49-80) 137/60 (09/27 0706) SpO2:  [91 %-100 %] 91 % (09/27 0706) Weight:  [108.5 kg] 108.5 kg (09/26 0930) Gen: NAD, pleasant and cooperative CV: Regular rate Resp: Normal work of breathing Abdomen: soft, non-distended, non-tender throughout Uterus: firm, non-tender, below umbilicus Ext: Trace bilateral LE edema, no bilateral calf tenderness  Labs:  Recent Labs    01/12/23 1057 01/13/23 0650  HGB 11.0* 9.5*  HCT 35.4* 30.3*    A/P: Patient is a 32 y.o. G2P1001 PPD#0 s/p SVD.  - Pain well controlled  - GU: UOP is adequate - GI: Tolerating regular diet - Activity: encouraged sitting up to chair and ambulation as tolerated - DVT Prophylaxis: Ambulation, SCDs in bed - Labs: as above, asymptomatic acute blood loss anemia - expectant management  Circumcision Consent:  Routine circumcisions performed on newborns have been identified as voluntary, elective procedures by [employer] such as the [employer].  It is considered an elective procedure with no definitive medical indication and carries risks. Risks include but are not limited to bleeding, infection, damage to penis with possible need for further surgery, poor cosmesis, and local anesthetic risks. Circumcision will only be performed if patient is deemed to have normal anatomy by his Pediatrician, meets adequate criteria for a newborn of similar gestational age after birth and is without infection or other medical issue contraindicating an elective procedure.  Patient understands and agrees. Patient discussed with mother of infant.  Disposition:  D/C home PPD#1-2.   Steva Ready, DO

## 2023-01-13 NOTE — Lactation Note (Signed)
This note was copied from a baby's chart. Lactation Consultation Note  Patient Name: Megan Cobb [id number] Date: 01/13/2023 Age:30 hours Reason for consult: Initial assessment;Primapara;1st time breastfeeding;Term;Breastfeeding assistance Per mom baby temp is low, confirmed with nurse -  LC offered to see if the baby would feed , per mom last feeding was at 7:40 am . LC changed a med sized black stool and baby wet while changing the diaper.  LC offered to assist with latch and mom receptive.  Baby awake after diaper change. Latched with depth , few swallows, and per mom comfortable , fed 10 mins , released on his own and nipple well rounded. Baby was STS near the breast and a loose blanket wrapped around the back and sides due to decreased temp.  LC reviewed BF goals for 24 hours - feed with cues and by 3 hours STS.     Maternal Data Has patient been taught Hand Expression?: Yes Does the patient have breastfeeding experience prior to this delivery?: No  Feeding Mother's Current Feeding Choice: Breast Milk  LATCH Score Latch: Grasps breast easily, tongue down, lips flanged, rhythmical sucking.  Audible Swallowing: A few with stimulation  Type of Nipple: Everted at rest and after stimulation  Comfort (Breast/Nipple): Soft / non-tender  Hold (Positioning): Assistance needed to correctly position infant at breast and maintain latch.  LATCH Score: 8   Lactation Tools Discussed/Used  None needed at this point   Interventions Interventions: Breast feeding basics reviewed;Assisted with latch;Skin to skin;Breast massage;Hand express;Breast compression;Adjust position;Support pillows;Position options;Education;LC Services brochure  Discharge Pump: Personal;Hands Free  Consult Status Consult Status: Follow-up Date: 01/14/23 Follow-up type: In-patient    [name] 01/13/2023, 11:20 AM

## 2023-01-14 DIAGNOSIS — D62 Acute posthemorrhagic anemia: Secondary | ICD-10-CM | POA: Diagnosis not present

## 2023-01-14 MED ORDER — POLYSACCHARIDE IRON COMPLEX 150 MG PO CAPS
150.0000 mg | ORAL_CAPSULE | Freq: Every day | ORAL | Status: DC
  Administered 2023-01-14: 150 mg via ORAL
  Filled 2023-01-14 (×2): qty 1

## 2023-01-14 MED ORDER — POLYSACCHARIDE IRON COMPLEX 150 MG PO CAPS: 150.0000 mg | ORAL_CAPSULE | Freq: Every day | ORAL | 2 refills | Status: AC

## 2023-01-14 MED ORDER — ALBUTEROL SULFATE (2.5 MG/3ML) 0.083% IN NEBU: 2.5000 mg | INHALATION_SOLUTION | Freq: Four times a day (QID) | RESPIRATORY_TRACT | Status: DC | PRN

## 2023-01-14 MED ORDER — IBUPROFEN 600 MG PO TABS: 600.0000 mg | ORAL_TABLET | Freq: Four times a day (QID) | ORAL | 0 refills | Status: AC

## 2023-01-14 NOTE — Lactation Note (Signed)
This note was copied from a baby's chart. Lactation Consultation Note  Patient Name: Megan Cobb [id number] Date: 01/14/2023 Age:30 hours Reason for consult: Follow-up assessment  P2, 11.9% weight loss.  Unsure if weight is correct. 4 viods and 6 stools in the last 24 hours.  Encouraged mother to breastfeed longer and offer the second breast each time.  Baby latched with ease.  Baby sustained latch on first breast for 20 min in cradle & cross cradle holds.  Switched to second breast.  Intermittent swallows observed.  Set up DEBP with 24 mm flanges.  Encouraged pumping q 3 hours for 15 min if mother is able.  Give volume back to baby. Demonstrated how to spoon feed.    Maternal Data Has patient been taught Hand Expression?: Yes Does the patient have breastfeeding experience prior to this delivery?: Yes How long did the patient breastfeed?:  (had difficulty, attempted)  Feeding Mother's Current Feeding Choice: Breast Milk  LATCH Score Latch: Grasps breast easily, tongue down, lips flanged, rhythmical sucking.  Audible Swallowing: A few with stimulation  Type of Nipple: Everted at rest and after stimulation  Comfort (Breast/Nipple): Soft / non-tender  Hold (Positioning): Assistance needed to correctly position infant at breast and maintain latch.  LATCH Score: 8   Lactation Tools Discussed/Used    Interventions Interventions: Breast feeding basics reviewed;Assisted with latch;Skin to skin;Hand express;DEBP;Education  Discharge Discharge Education: Engorgement and breast care;Warning signs for feeding baby Pump: Personal;Hands Free (Mom Cozi)  Consult Status Consult Status: Follow-up Date: 01/15/23 Follow-up type: In-patient    Dahlia Byes [facility] 01/14/2023, 11:03 AM

## 2023-01-14 NOTE — Progress Notes (Signed)
PPD# 1 SVD w/ Periurethral;1st degree;Perineal  Information for the patient's newborn:  Megan Cobb, Megan Cobb [[id number]]  female  Circumcision prior to discharge  S:   Reports feeling tired Tolerating PO fluid and solids No nausea or vomiting Bleeding is light Pain controlled with acetaminophen and ibuprofen (OTC) Up ad lib / ambulatory / voiding w/o difficulty Feeding: Breast    O:   VS: BP 128/60 (BP Location: Right Arm)   Pulse 95   Temp 98.8 F (37.1 C) (Oral)   Resp 18   Ht 5\' 3"  (1.6 m)   Wt 108.5 kg   SpO2 99%   BMI 42.37 kg/m   LABS:  Recent Labs    01/12/23 1057 01/13/23 0650  WBC 9.0 15.3*  HGB 11.0* 9.5*  PLT 168 155   Blood type: --/--/A POS (09/26 1053) Rubella: Immune (02/07 0000)                      I&O: Intake/Output      09/27 0701 09/28 0700       Urine Occurrence 1 x     Physical Exam: Alert and oriented X3 Lungs: Clear and unlabored Heart: regular rate and rhythm / no mumurs Abdomen: soft, non-tender, non-distended  Fundus: firm, non-tender, U-2 Perineum: well-approximated Lochia: appropriate Extremities: trace edema, negative for calf pain, tenderness, or cords    A:  PPD # 1  Normal exam  P:  Routine postpartum orders Anticipate D/C on PP day 2 Plan reviewed w/ Dr.Dillard  Roma Schanz, DNP, CNM 01/14/2023, 5:54 AM

## 2023-01-14 NOTE — Anesthesia Postprocedure Evaluation (Signed)
Anesthesia Post Note  Patient: Megan Cobb  Procedure(s) Performed: AN AD HOC LABOR EPIDURAL     Patient location during evaluation: Mother Baby Anesthesia Type: Epidural Level of consciousness: awake and alert and oriented Pain management: satisfactory to patient Vital Signs Assessment: post-procedure vital signs reviewed and stable Respiratory status: respiratory function stable Cardiovascular status: stable Postop Assessment: no headache, no backache, epidural receding, patient able to bend at knees, no signs of nausea or vomiting, adequate PO intake and able to ambulate Anesthetic complications: no   No notable events documented.  Last Vitals:  Vitals:   01/13/23 2050 01/14/23 0505  BP: 126/64 128/60  Pulse: 89 95  Resp: 16 18  Temp: 36.7 C 37.1 C  SpO2: 100% 99%    Last Pain:  Vitals:   01/14/23 0930  TempSrc:   PainSc: 4    Pain Goal:                   Isaak Delmundo

## 2023-01-14 NOTE — Discharge Summary (Incomplete)
SVD OB Discharge Summary       Patient Name: Megan Cobb DOB: 06/13/1990 MRN: [id number]  Date of admission: 01/12/2023 Delivering MD: Gerald Leitz Date of delivery: 9/27 Type of delivery: SVD  Newborn Data: Sex: Baby female Circumcision: in pt done Live born female  Birth Weight: 7 lb 7.6 oz (3390 g) APGAR: 8, 9  Newborn Delivery   Birth date/time: 01/13/2023 02:53:00 Delivery type: Vaginal, Spontaneous     Feeding: breast and bottle Infant being discharge to home with mother in stable condition.   Admitting diagnosis: Encounter for induction of labor [Z34.90] Intrauterine pregnancy: [redacted]w[redacted]d     Secondary diagnosis:  Principal Problem:   Encounter for induction of labor Active Problems:   Acute blood loss anemia   Normal postpartum course   SVD (spontaneous vaginal delivery)                                Complications: None                                                              Intrapartum Procedures: spontaneous vaginal delivery Postpartum Procedures: none Complications-Operative and Postpartum:  1st degree perineal laceration Augmentation: Cytotec   History of Present Illness: Megan Cobb is a 32 y.o. female, G2P1001, who presents at [redacted]w[redacted]d weeks gestation. The patient has been followed at [facility]  Her pregnancy has been complicated by:  Patient Active Problem List   Diagnosis Date Noted   Acute blood loss anemia 01/14/2023   Normal postpartum course 01/14/2023   SVD (spontaneous vaginal delivery) 01/14/2023   Encounter for induction of labor 01/12/2023   PROM (premature rupture of membranes) 01/18/2016     Active Ambulatory Problems    Diagnosis Date Noted   PROM (premature rupture of membranes) 01/18/2016   Resolved Ambulatory Problems    Diagnosis Date Noted   No Resolved Ambulatory Problems   Past Medical History:  Diagnosis Date   Anemia    Asthma      Hospital course:  Induction of Labor With  Vaginal Delivery   32 y.o. yo G2P1001 at [redacted]w[redacted]d was admitted to the hospital 01/12/2023 for induction of labor.  Indication for induction: Postdates.  Patient had an labor course complicated bypt was admitted on 9/25 for IOL for postdates, h/o asthma, had SVD on 9/27 after cytotec, over 1st degree repaired, ebl was , hgb drop of 12.7-10.4, asymptomatic, iron rich food recommended.  Membrane Rupture Time/Date: 7:15 PM,01/12/2023  Delivery Method:Vaginal, Spontaneous Operative Delivery:N/A Episiotomy: None Lacerations:  Periurethral;1st degree;Perineal Details of delivery can be found in separate delivery note.  Patient had a postpartum course complicated byNone. Patient is discharged home 01/15/23.  Newborn Data: Birth date:01/13/2023 Birth time:2:53 AM Gender:Female Living status:Living Apgars:8 ,9  Weight:3390 g Postpartum Day # 2 : Patient up ad lib, denies syncope or dizziness. Reports consuming regular diet without issues and denies N/V. Patient reports 0 bowel movement + passing flatus.  Denies issues with urination and reports bleeding is "lighter."  Patient is breast and bottle feeding and reports going well.  Desires undecided for postpartum contraception.  Pain is being appropriately managed with use of po meds.  Physical exam  Vitals:   01/14/23 0505 01/14/23 1627 01/14/23 2113 01/15/23 0456  BP: 128/60 126/72 124/69 134/72  Pulse: 95 90 90 91  Resp: 18 16 18 18   Temp: 98.8 F (37.1 C) 98.6 F (37 C) 98.7 F (37.1 C) 98.7 F (37.1 C)  TempSrc: Oral Oral Oral Oral  SpO2: 99% 100% 100% 98%  Weight:      Height:       General: alert, cooperative, and no distress Lochia: appropriate Uterine Fundus: firm Perineum: approximate DVT Evaluation: No evidence of DVT seen on physical exam. Negative Homan's sign. No cords or calf tenderness. No significant calf/ankle edema.  Labs: Lab Results  Component Value Date   WBC 15.3 (H) 01/13/2023   HGB 9.5 (L) 01/13/2023    HCT 30.3 (L) 01/13/2023   MCV 77.9 (L) 01/13/2023   PLT 155 01/13/2023       No data to display          Date of discharge: 01/15/2023 Discharge Diagnoses:  Late term delivery Discharge instruction: per After Visit Summary and "Baby and Me Booklet".  After visit meds:   Activity:           unrestricted and pelvic rest Advance as tolerated. Pelvic rest for 6 weeks.  Diet:                routine Medications: PNV and Ibuprofen Postpartum contraception: Undecided Condition:  Pt discharge to home with baby in stable condition   Meds: Allergies as of 01/15/2023       Reactions   Meloxicam Hives   Methocarbamol Rash        Medication List     STOP taking these medications    amoxicillin 500 MG capsule Commonly known as: AMOXIL   calcium carbonate 500 MG chewable tablet Commonly known as: TUMS - dosed in mg elemental calcium   cetirizine 10 MG tablet Commonly known as: ZYRTEC   diphenhydrAMINE 25 MG tablet Commonly known as: BENADRYL   docusate sodium 100 MG capsule Commonly known as: Colace   lidocaine 2 % solution Commonly known as: XYLOCAINE   loratadine 10 MG tablet Commonly known as: CLARITIN   meloxicam 15 MG tablet Commonly known as: MOBIC   oxyCODONE-acetaminophen 5-325 MG tablet Commonly known as: PERCOCET/ROXICET       TAKE these medications    acetaminophen 500 MG tablet Commonly known as: TYLENOL Take 500-1,000 mg by mouth every 6 (six) hours as needed for mild pain, moderate pain or headache.   albuterol 108 (90 Base) MCG/ACT inhaler Commonly known as: VENTOLIN HFA Inhale 2 puffs into the lungs every 6 (six) hours as needed for wheezing or shortness of breath.   ibuprofen 600 MG tablet Commonly known as: ADVIL Take 1 tablet (600 mg total) by mouth every 6 (six) hours.   iron polysaccharides 150 MG capsule Commonly known as: NIFEREX Take 1 capsule (150 mg total) by mouth daily.   multivitamin-prenatal 27-0.8 MG Tabs tablet Take  1 tablet by mouth at bedtime.        Discharge Follow Up:   Follow-up Information     Gynecology, [facility]. Schedule an appointment as soon as possible for a visit in 6 week(s).   Specialty: Obstetrics and Gynecology Contact information: [address] [place] [address] [phone]                  [facility] CNM, FNP-C, PMHNP-BC  [address] [facility] # [phone]  [place], [place] [address]  Cell: [phone]  Office Phone: [phone] Fax: [phone] 01/15/2023  8:12 AM

## 2023-01-14 NOTE — Lactation Note (Signed)
This note was copied from a baby's chart. Lactation Consultation Note  Patient Name: Megan Cobb [id number] Date: 01/14/2023 Age:30 hours Reason for consult: Follow-up assessment;Term;Infant weight loss  P2- LC to see MOB. MOB had a Do Not Disturb sign on door.  Feeding Mother's Current Feeding Choice: Breast Milk  Consult Status Consult Status: Follow-up Date: 01/14/23 Follow-up type: In-patient    Dema Severin BS, IBCLC 01/14/2023, 8:13 AM

## 2023-01-15 NOTE — Lactation Note (Signed)
This note was copied from a baby's chart. Lactation Consultation Note  Patient Name: Boy Taleya Whitcher [id number] Date: 01/15/2023 Age:30 hours Reason for consult: Follow-up assessment;Term;Infant weight loss;Maternal discharge  P2- MOB states that she is excited because she feels as if her baby is perking up and knowing exactly how to nurse at the breast. MOB stated that her first child never "got the hang of breastfeeding", so this is reassuring for her. MOB denies having any questions or concerns at this time.   LC reviewed LC services handout, CDC milk storage guidelines, engorgement/breast care, feeding infant on cue 8-12x in 24 hrs and not allowing infant to go over 3 hrs without a feeding. LC encouraged MOB to call if she has any questions or concerns.  Maternal Data Does the patient have breastfeeding experience prior to this delivery?: Yes  Feeding Mother's Current Feeding Choice: Breast Milk  Lactation Tools Discussed/Used Pump Education: Milk Storage  Interventions Interventions: Breast feeding basics reviewed;Education;LC Services brochure  Discharge Discharge Education: Engorgement and breast care;Warning signs for feeding baby Pump: DEBP;Personal  Consult Status Consult Status: Complete Date: 01/15/23    Dema Severin BS, IBCLC 01/15/2023, 3:31 PM

## 2023-01-16 ENCOUNTER — Encounter (HOSPITAL_COMMUNITY): Payer: Self-pay

## 2023-01-16 ENCOUNTER — Inpatient Hospital Stay (HOSPITAL_COMMUNITY)
Admission: AD | Admit: 2023-01-16 | Discharge: 2023-01-16 | Disposition: A | Discharge status: Home / Self Care | Payer: BC Managed Care – PPO | Attending: Obstetrics and Gynecology | Admitting: Obstetrics and Gynecology

## 2023-01-16 ENCOUNTER — Inpatient Hospital Stay (HOSPITAL_COMMUNITY): Payer: BC Managed Care – PPO

## 2023-01-16 DIAGNOSIS — R519 Headache, unspecified: Secondary | ICD-10-CM | POA: Diagnosis not present

## 2023-01-16 DIAGNOSIS — R0602 Shortness of breath: Secondary | ICD-10-CM | POA: Insufficient documentation

## 2023-01-16 DIAGNOSIS — O9089 Other complications of the puerperium, not elsewhere classified: Secondary | ICD-10-CM | POA: Diagnosis not present

## 2023-01-16 DIAGNOSIS — R600 Localized edema: Secondary | ICD-10-CM | POA: Diagnosis not present

## 2023-01-16 DIAGNOSIS — O165 Unspecified maternal hypertension, complicating the puerperium: Secondary | ICD-10-CM | POA: Insufficient documentation

## 2023-01-16 LAB — COMPREHENSIVE METABOLIC PANEL
ALT: 44 U/L (ref 0–44)
AST: 43 U/L — ABNORMAL HIGH (ref 15–41)
Albumin: 2.5 g/dL — ABNORMAL LOW (ref 3.5–5.0)
Alkaline Phosphatase: 110 U/L (ref 38–126)
Anion gap: 13 (ref 5–15)
BUN: 13 mg/dL (ref 6–20)
CO2: 20 mmol/L — ABNORMAL LOW (ref 22–32)
Calcium: 8.9 mg/dL (ref 8.9–10.3)
Chloride: 106 mmol/L (ref 98–111)
Creatinine, Ser: 0.83 mg/dL (ref 0.44–1.00)
GFR, Estimated: >60 mL/min (ref >60)
Glucose, Bld: 108 mg/dL — ABNORMAL HIGH (ref 70–99)
Potassium: 3.6 mmol/L (ref 3.5–5.1)
Sodium: 139 mmol/L (ref 135–145)
Total Bilirubin: <0.1 mg/dL — ABNORMAL LOW (ref 0.3–1.2)
Total Protein: 6.2 g/dL — ABNORMAL LOW (ref 6.5–8.1)

## 2023-01-16 LAB — CBC
HCT: 34.5 % — ABNORMAL LOW (ref 36.0–46.0)
Hemoglobin: 10.6 g/dL — ABNORMAL LOW (ref 12.0–15.0)
MCH: 24.5 pg — ABNORMAL LOW (ref 26.0–34.0)
MCHC: 30.7 g/dL (ref 30.0–36.0)
MCV: 79.7 fL — ABNORMAL LOW (ref 80.0–100.0)
Platelets: 191 10*3/uL (ref 150–400)
RBC: 4.33 MIL/uL (ref 3.87–5.11)
RDW: 15.8 % — ABNORMAL HIGH (ref 11.5–15.5)
WBC: 11.1 10*3/uL — ABNORMAL HIGH (ref 4.0–10.5)
nRBC: 0.2 % (ref 0.0–0.2)

## 2023-01-16 MED ORDER — IOHEXOL 350 MG/ML SOLN
80.0000 mL | Freq: Once | INTRAVENOUS | Status: AC | PRN
  Administered 2023-01-16: 80 mL via INTRAVENOUS

## 2023-01-16 MED ORDER — ENOXAPARIN SODIUM 120 MG/0.8ML IJ SOSY
110.0000 mg | PREFILLED_SYRINGE | Freq: Two times a day (BID) | INTRAMUSCULAR | Status: DC
  Administered 2023-01-16: 110 mg via SUBCUTANEOUS
  Filled 2023-01-16: qty 0.74

## 2023-01-16 MED ORDER — ACETAMINOPHEN-CAFFEINE 500-65 MG PO TABS
2.0000 | ORAL_TABLET | Freq: Once | ORAL | Status: AC
  Administered 2023-01-16: 2 via ORAL
  Filled 2023-01-16: qty 2

## 2023-01-16 MED ORDER — FUROSEMIDE 20 MG PO TABS: 20.0000 mg | ORAL_TABLET | Freq: Every day | ORAL | 0 refills | Status: AC

## 2023-01-16 NOTE — Discharge Instructions (Addendum)
If tomorrow is a weekday (Monday-Friday), please go to [facility] Clinic Registration at 11 am and tell them you are there for a vascular study.

## 2023-01-16 NOTE — MAU Note (Signed)
Megan Cobb is a 32 y.o. at Unknown here in MAU reporting: vag del 9/27, dc's 9/29.  Been dealing with swelling.  Had an IV, and an Epid.  Only took Tylenol during recovery.  Doesn't like to mix meds.   Main problem is swelling.  Also having chest pain, started today between 12 and 1. Feels like she her throat is swollen, - first noted last night, was hard to breath.  Called her dr, was told to come in and be evaluated because of chest pain.  Mom checked her BP at home 152/98.. has HA now.  Felt l9ight headed while getting ready to come in .Marland Kitchen Onset of complaint: last night. Pain score:chest 5, HA 4 (hasn't taken anymore medication), took 2 ES Tylenol last night.  Vitals:   01/16/23 1521  BP: (!) 146/86  Pulse: (!) 107  Resp: 20  Temp: 98.3 F (36.8 C)  SpO2: 100%      Lab orders placed from triage:

## 2023-01-16 NOTE — MAU Provider Note (Signed)
History     CSN: [id number]  Arrival date and time: 01/16/23 1417   None     Chief Complaint  Patient presents with   swelling    Generalized, c/o swelling all over   Headache   Hypertension   HPI Megan Cobb is a 32 y.o. [id number] who is 3 days postpartum coming in with complaints of headache, lower extremity edema, and shortness of breath. She reports edema worsened throughout her admission and upon discharge. She has not been able to ambulate much since discharge, has mainly been walking to bathroom and back to bed due to weakness. She reports having a headache and dyspnea earlier in the day, her mother took her BP and it was 152/98. She called her primary OB who recommended evaluation in the MAU. She also reports left lower extremity swelling and pain, which is notably worse than her right lower extremity. She tried taking Tylenol for the headache -- 1g yesterday, which did help symptoms some.   Past Medical History:  Diagnosis Date   Anemia    Asthma     Past Surgical History:  Procedure Laterality Date   NO PAST SURGERIES      Family History  Problem Relation Age of Onset   Hypertension Mother    Hypertension Father    Cancer Paternal Grandmother    Cancer Paternal Grandfather     Social History   Tobacco Use   Smoking status: Never   Smokeless tobacco: Never  Vaping Use   Vaping status: Never Used  Substance Use Topics   Alcohol use: No   Drug use: No    Allergies:  Allergies  Allergen Reactions   Meloxicam Hives   Methocarbamol Rash    Medications Prior to Admission  Medication Sig Dispense Refill Last Dose   acetaminophen (TYLENOL) 500 MG tablet Take 500-1,000 mg by mouth every 6 (six) hours as needed for mild pain, moderate pain or headache.    01/15/2023   Prenatal Vit-Fe Fumarate-FA (MULTIVITAMIN-PRENATAL) 27-0.8 MG TABS tablet Take 1 tablet by mouth at bedtime.    01/16/2023   albuterol (PROVENTIL HFA;VENTOLIN HFA) 108 (90 Base) MCG/ACT  inhaler Inhale 2 puffs into the lungs every 6 (six) hours as needed for wheezing or shortness of breath.      ibuprofen (ADVIL) 600 MG tablet Take 1 tablet (600 mg total) by mouth every 6 (six) hours. 30 tablet 0    iron polysaccharides (NIFEREX) 150 MG capsule Take 1 capsule (150 mg total) by mouth daily. 30 capsule 2    ROS reviewed and pertinent positives and negatives as documented in HPI.  Physical Exam   Blood pressure 122/77, pulse (!) 101, temperature 98.3 F (36.8 C), temperature source Oral, resp. rate 20, SpO2 98%, currently breastfeeding.  Physical Exam Constitutional:      General: She is not in acute distress.    Appearance: Normal appearance. She is obese.  HENT:     Head: Normocephalic and atraumatic.  Cardiovascular:     Rate and Rhythm: Regular rhythm. Tachycardia present.     Heart sounds: Normal heart sounds.  Pulmonary:     Effort: Pulmonary effort is normal. No respiratory distress.     Breath sounds: Normal breath sounds. No rales.  Abdominal:     General: There is no distension.     Palpations: Abdomen is soft.     Tenderness: There is no abdominal tenderness. There is no right CVA tenderness or left CVA tenderness.  Musculoskeletal:  General: Swelling (swelling of bilateral lower extremities to knees, L>R) and tenderness (left lower extremity pain with palpation of calf) present. Normal range of motion.  Skin:    General: Skin is warm and dry.     Findings: No rash.  Neurological:     General: No focal deficit present.     Mental Status: She is alert and oriented to person, place, and time.     Motor: No weakness.  Psychiatric:        Mood and Affect: Mood normal.        Behavior: Behavior normal.     MAU Course  Procedures  MDM 32 y.o. [id number] who is PPD#3 here with elevated BP, headache, SOB, edema (L>R) in setting of poor mobility. Vital signs notable for BP in the 140s/90s, tachycardia, and lower extremities with 2+ nonpitting edema  bilaterally, with left slightly worse than right, pain with palpation of calf. Ddx includes postpartum pre-eclampsia, edema 2/2 IV fluids during admission, PE. Wells score is 6, so proceeded with CTA chest. Labs unremarkable -- Cr ok, LFTs ok, PLT wnl. CTA chest with no PE to segmental level. HA improved with Excedrin, but LLE pain/edema still present. Will tx BP and edema with Lasix 20mg  x 5 days, given a dose of Lovenox and ordered Duplex lower extremity to be done in AM. Discussed in detail plan to pt and her partner. Stable for d/c.  Assessment and Plan  Postpartum hypertension - Plan: VAS [place] LOWER EXTREMITY VENOUS (DVT) MAU OP, Discharge patient Will tx with Lasix Rec f/up BP check in 2 days PEC w/up unremarkable  Lower extremity edema - Plan: VAS [place] LOWER EXTREMITY VENOUS (DVT) MAU OP, Discharge patient Lasix ordered Rec increasing mobility Stable for d/c  Shortness of breath - Plan: VAS [place] LOWER EXTREMITY VENOUS (DVT) MAU OP, Discharge patient CTA chest neg to segmental level  Plan for Duplex in AM Given dose of ttx Lovenox prior to d/c.  Patient discussed with Dr. Shawnie Pons.  Sundra Aland, MD OB Fellow, Faculty Practice [facility], [facility]  01/16/2023, 8:40 PM

## 2023-01-17 ENCOUNTER — Encounter (HOSPITAL_COMMUNITY): Payer: Self-pay | Admitting: Obstetrics and Gynecology

## 2023-01-17 ENCOUNTER — Inpatient Hospital Stay (HOSPITAL_COMMUNITY)
Admission: AD | Admit: 2023-01-17 | Discharge: 2023-01-18 | Disposition: A | Discharge status: Home / Self Care | Payer: BC Managed Care – PPO | Attending: Obstetrics and Gynecology | Admitting: Obstetrics and Gynecology

## 2023-01-17 ENCOUNTER — Inpatient Hospital Stay (HOSPITAL_COMMUNITY): Payer: BC Managed Care – PPO

## 2023-01-17 ENCOUNTER — Ambulatory Visit (HOSPITAL_COMMUNITY)
Admission: RE | Admit: 2023-01-17 | Discharge: 2023-01-17 | Disposition: A | Discharge status: Home / Self Care | Payer: BC Managed Care – PPO | Source: Ambulatory Visit | Attending: Family Medicine | Admitting: Family Medicine

## 2023-01-17 DIAGNOSIS — R519 Headache, unspecified: Secondary | ICD-10-CM

## 2023-01-17 DIAGNOSIS — R0602 Shortness of breath: Secondary | ICD-10-CM | POA: Insufficient documentation

## 2023-01-17 DIAGNOSIS — R0789 Other chest pain: Secondary | ICD-10-CM

## 2023-01-17 DIAGNOSIS — R252 Cramp and spasm: Secondary | ICD-10-CM | POA: Diagnosis not present

## 2023-01-17 DIAGNOSIS — O9089 Other complications of the puerperium, not elsewhere classified: Secondary | ICD-10-CM | POA: Insufficient documentation

## 2023-01-17 DIAGNOSIS — R600 Localized edema: Secondary | ICD-10-CM | POA: Insufficient documentation

## 2023-01-17 DIAGNOSIS — J45909 Unspecified asthma, uncomplicated: Secondary | ICD-10-CM | POA: Diagnosis not present

## 2023-01-17 DIAGNOSIS — O1205 Gestational edema, complicating the puerperium: Secondary | ICD-10-CM | POA: Insufficient documentation

## 2023-01-17 DIAGNOSIS — J4520 Mild intermittent asthma, uncomplicated: Secondary | ICD-10-CM | POA: Diagnosis not present

## 2023-01-17 DIAGNOSIS — Z1152 Encounter for screening for COVID-19: Secondary | ICD-10-CM | POA: Diagnosis not present

## 2023-01-17 DIAGNOSIS — R220 Localized swelling, mass and lump, head: Secondary | ICD-10-CM

## 2023-01-17 DIAGNOSIS — O165 Unspecified maternal hypertension, complicating the puerperium: Secondary | ICD-10-CM | POA: Insufficient documentation

## 2023-01-17 LAB — RESP PANEL BY RT-PCR (RSV, FLU A&B, COVID)  RVPGX2
Influenza A by PCR: NEGATIVE
Influenza B by PCR: NEGATIVE
Resp Syncytial Virus by PCR: NEGATIVE
SARS Coronavirus 2 by RT PCR: NEGATIVE

## 2023-01-17 MED ORDER — ALBUTEROL SULFATE HFA 108 (90 BASE) MCG/ACT IN AERS
2.0000 | INHALATION_SPRAY | Freq: Once | RESPIRATORY_TRACT | Status: AC
  Administered 2023-01-17: 2 via RESPIRATORY_TRACT
  Filled 2023-01-17: qty 6.7

## 2023-01-17 MED ORDER — LORATADINE 10 MG PO TABS
10.0000 mg | ORAL_TABLET | Freq: Every day | ORAL | Status: DC
  Administered 2023-01-17: 10 mg via ORAL
  Filled 2023-01-17: qty 1

## 2023-01-17 MED ORDER — ACETAMINOPHEN 325 MG PO TABS
650.0000 mg | ORAL_TABLET | Freq: Once | ORAL | Status: AC
  Administered 2023-01-17: 650 mg via ORAL
  Filled 2023-01-17: qty 2

## 2023-01-17 NOTE — Progress Notes (Signed)
Duplex neg for DVT.

## 2023-01-17 NOTE — MAU Provider Note (Signed)
Chief Complaint:  No chief complaint on file.   Event Date/Time   First Provider Initiated Contact with Patient 01/17/23 2050       HPI: Megan Cobb is a 32 y.o. [id number] who presents to maternity admissions reporting ***. She reports vaginal bleeding, vaginal itching/burning, urinary symptoms, h/a, dizziness, n/v, or fever/chills.    HPI RN Note: Megan Cobb is a 32 y.o. at Unknown here in MAU reporting taking medication to help the inflammation and to help her urinate and she is better. Today has swelling on her L side, throat is swollen, chest is tight, SOB. Also has some pressure in her head. Unsure if allergic reaction or what? Wanted to be sure everything is ok. Throat swelling started Sunday night and has been coming and going. Pressure of head is new. Chest pain started yesterday. Also wants her nerves checked from displacement of epidural on Friday. Having a lot of shooting, sharp pains in legs and feet and twitching in extremities. Left arm harder to lift than her right. Pt has not taken any pain meds today. States she has "been pretty good until now" Onset of complaint: Sunday Pain score: 7  Past Medical History: Past Medical History:  Diagnosis Date   Anemia    Asthma     Past obstetric history: OB History  Gravida Para Term Preterm AB Living  2 2 2     2   SAB IAB Ectopic Multiple Live Births        0 2    # Outcome Date GA Lbr Len/2nd Weight Sex Type Anes PTL Lv  2 Term 01/13/23 [redacted]w[redacted]d   M CS-Classical   LIV  1 Term 01/19/16 [redacted]w[redacted]d 30:47 / 01:08 2940 g F Vag-Spont EPI  LIV    Past Surgical History: Past Surgical History:  Procedure Laterality Date   NO PAST SURGERIES      Family History: Family History  Problem Relation Age of Onset   Hypertension Mother    Hypertension Father    Cancer Paternal Grandmother    Cancer Paternal Grandfather     Social History: Social History   Tobacco Use   Smoking status: Never   Smokeless tobacco: Never   Vaping Use   Vaping status: Never Used  Substance Use Topics   Alcohol use: No   Drug use: No    Allergies:  Allergies  Allergen Reactions   Meloxicam Hives   Methocarbamol Rash    Meds:  Medications Prior to Admission  Medication Sig Dispense Refill Last Dose   acetaminophen (TYLENOL) 500 MG tablet Take 500-1,000 mg by mouth every 6 (six) hours as needed for mild pain, moderate pain or headache.       albuterol (PROVENTIL HFA;VENTOLIN HFA) 108 (90 Base) MCG/ACT inhaler Inhale 2 puffs into the lungs every 6 (six) hours as needed for wheezing or shortness of breath.      furosemide (LASIX) 20 MG tablet Take 1 tablet (20 mg total) by mouth daily for 5 days. 5 tablet 0    ibuprofen (ADVIL) 600 MG tablet Take 1 tablet (600 mg total) by mouth every 6 (six) hours. 30 tablet 0    iron polysaccharides (NIFEREX) 150 MG capsule Take 1 capsule (150 mg total) by mouth daily. 30 capsule 2    Prenatal Vit-Fe Fumarate-FA (MULTIVITAMIN-PRENATAL) 27-0.8 MG TABS tablet Take 1 tablet by mouth at bedtime.        I have reviewed patient's Past Medical Hx, Surgical Hx, Family Hx, Social Hx,  medications and allergies.  ROS:  Review of Systems Other systems negative     Physical Exam  Patient Vitals for the past 24 hrs:  BP Temp Pulse Resp SpO2 Height Weight  01/17/23 2011 135/80 -- -- -- -- -- 105.7 kg  01/17/23 2007 -- 98.8 F (37.1 C) (!) 102 18 100 % 5\' 3"  (1.6 m) --   Constitutional: Well-developed, well-nourished female in no acute distress.  Cardiovascular: normal rate and rhythm, no ectopy audible, S1 & S2 heard, no murmur Respiratory: normal effort, no distress. Lungs CTAB with no wheezes or crackles GI: Abd soft, non-tender.  Nondistended.  No rebound, No guarding.  Bowel Sounds audible  MS: Extremities nontender, no edema, normal ROM Neurologic: Alert and oriented x 4.   Grossly nonfocal. GU: Neg CVAT. Skin:  Warm and Dry Psych:  Affect appropriate.  PELVIC EXAM: Cervix pink,  visually closed, without lesion, scant white creamy discharge, vaginal walls and external genitalia normal Bimanual exam: Cervix firm, anterior, neg CMT, uterus nontender, nonenlarged, adnexa without tenderness, enlargement, or mass  Exam is limited by body habitus.    Labs: No results found for this or any previous visit (from the past 24 hour(s)). --/--/A POS (09/26 1053)  Imaging:  VAS [place] LOWER EXTREMITY VENOUS (DVT) MAU OP  Result Date: 01/17/2023  Lower Venous DVT Study Patient Name:  Megan Cobb [facility]  Date of Exam:   01/17/2023 Medical Rec #: [id number]            Accession #:    [id number] Date of Birth: 1990-10-23             Patient Gender: F Patient Age:   32 years Exam Location:  [facility] Procedure:      VAS [place] LOWER EXTREMITY VENOUS (DVT) Referring Phys: Sundra Aland --------------------------------------------------------------------------------  Indications: Swelling.  Risk Factors: Trauma Recent childbirth past pregnancy. Comparison Study: No prior study Performing Technologist: Shona Simpson  Examination Guidelines: A complete evaluation includes B-mode imaging, spectral Doppler, color Doppler, and power Doppler as needed of all accessible portions of each vessel. Bilateral testing is considered an integral part of a complete examination. Limited examinations for reoccurring indications may be performed as noted. The reflux portion of the exam is performed with the patient in reverse Trendelenburg.  +-----+---------------+---------+-----------+----------+--------------+ RIGHTCompressibilityPhasicitySpontaneityPropertiesThrombus Aging +-----+---------------+---------+-----------+----------+--------------+ CFV  Full           Yes      Yes                                 +-----+---------------+---------+-----------+----------+--------------+   +---------+---------------+---------+-----------+----------+--------------+ LEFT      CompressibilityPhasicitySpontaneityPropertiesThrombus Aging +---------+---------------+---------+-----------+----------+--------------+ CFV      Full           Yes      Yes                                 +---------+---------------+---------+-----------+----------+--------------+ SFJ      Full                                                        +---------+---------------+---------+-----------+----------+--------------+ FV Prox  Full                                                        +---------+---------------+---------+-----------+----------+--------------+  FV Mid   Full                                                        +---------+---------------+---------+-----------+----------+--------------+ FV DistalFull                                                        +---------+---------------+---------+-----------+----------+--------------+ PFV      Full                                                        +---------+---------------+---------+-----------+----------+--------------+ POP      Full           Yes      Yes                                 +---------+---------------+---------+-----------+----------+--------------+ PTV      Full                                                        +---------+---------------+---------+-----------+----------+--------------+ PERO     Full                                                        +---------+---------------+---------+-----------+----------+--------------+     Summary: RIGHT: - No evidence of common femoral vein obstruction.   LEFT: - There is no evidence of deep vein thrombosis in the lower extremity.  - No cystic structure found in the popliteal fossa.  *See table(s) above for measurements and observations. Electronically signed by Carolynn Sayers on 01/17/2023 at 1:28:17 PM.    Final    CT Angio Chest Pulmonary Embolism (PE) W or WO Contrast  Result Date: 01/16/2023 CLINICAL DATA:   Shortness of breath, immobility, and left lower extremity edema. EXAM: CT ANGIOGRAPHY CHEST WITH CONTRAST TECHNIQUE: Multidetector CT imaging of the chest was performed using the standard protocol during bolus administration of intravenous contrast. Multiplanar CT image reconstructions and MIPs were obtained to evaluate the vascular anatomy. RADIATION DOSE REDUCTION: This exam was performed according to the departmental dose-optimization program which includes automated exposure control, adjustment of the mA and/or kV according to patient size and/or use of iterative reconstruction technique. CONTRAST:  80mL OMNIPAQUE IOHEXOL 350 MG/ML SOLN COMPARISON:  No prior films or CT for comparison. FINDINGS: Cardiovascular: The heart is slightly enlarged. Pulmonary arteries and veins are normal in caliber. There is no venous distention no pericardial effusion. No arterial embolism is seen at least to the segmental level. The subsegmental arterial bed is poorly opacified due to bolus timing, and subsegmental emboli could be missed. The aorta and great  vessels are normal. Mediastinum/Nodes: No enlarged mediastinal, hilar, or axillary lymph nodes. Thyroid gland, trachea, and esophagus demonstrate no significant findings. Lungs/Pleura: There is no pulmonary consolidation, pleural effusion or pneumothorax. No pulmonary nodules. There is linear scarring or atelectasis laterally in the left lower lobe and lateral extreme right base. The lungs are otherwise clear. Upper Abdomen: No acute abnormality. Musculoskeletal: No abnormality is seen in the visualized chest wall. No acute or significant osseous findings. Review of the MIP images confirms the above findings. IMPRESSION: 1. No arterial embolism is seen at least to the segmental level. The subsegmental arterial bed is poorly opacified due to bolus timing. 2. Slight cardiomegaly. No venous distention or edema. 3. No acute chest CT or CTA findings. Electronically Signed   By: Almira Bar M.D.   On: 01/16/2023 20:09    MAU Course/MDM: I have reviewed the triage vital signs and the nursing notes.   Pertinent labs & imaging results that were available during my care of the patient were reviewed by me and considered in my medical decision making (see chart for details).      I have reviewed her medical records including past results, notes and treatments.   I have ordered labs as follows: Imaging ordered:  Results reviewed.   Consult ***.   Treatments in MAU included ***.   Pt stable at time of discharge.  Assessment: No diagnosis found.  Plan: Discharge home Recommend *** Rx sent for *** for ***   Encouraged to return here or to other Urgent Care/ED if she develops worsening of symptoms, increase in pain, fever, or other concerning symptoms.   Wynelle Bourgeois CNM, MSN Certified Nurse-Midwife 01/17/2023 8:50 PM

## 2023-01-17 NOTE — MAU Note (Addendum)
.  Megan Cobb is a 32 y.o. at Unknown here in MAU reporting taking medication to help the inflammation and to help her urinate and she is better. Today has swelling on her L side, throat is swollen, chest is tight, SOB. Also has some pressure in her head. Unsure if allergic reaction or what? Wanted to be sure everything is ok. Throat swelling started Sunday night and has been coming and going. Pressure of head is new. Chest pain started yesterday. Also wants her nerves checked from displacement of epidural on Friday. Having a lot of shooting, sharp pains in legs and feet and twitching in extremities. Left arm harder to lift than her right. Pt has not taken any pain meds today. States she has "been pretty good until now" Onset of complaint: Sunday Pain score: 7 Vitals:   01/17/23 2007 01/17/23 2011  BP:  135/80  Pulse: (!) 102   Resp: 18   Temp: 98.8 F (37.1 C)   SpO2: 100%      FHT:n/a Lab orders placed from triage: none77

## 2023-01-17 NOTE — Progress Notes (Signed)
Lower extremity venous duplex completed. Please see CV Procedures for preliminary results.  Shona Simpson, RVT 01/17/23 11:40 AM

## 2023-01-18 ENCOUNTER — Encounter (HOSPITAL_COMMUNITY): Payer: Self-pay | Admitting: Obstetrics and Gynecology

## 2023-01-18 DIAGNOSIS — R0789 Other chest pain: Secondary | ICD-10-CM | POA: Diagnosis not present

## 2023-01-18 DIAGNOSIS — R519 Headache, unspecified: Secondary | ICD-10-CM | POA: Diagnosis not present

## 2023-01-18 DIAGNOSIS — R220 Localized swelling, mass and lump, head: Secondary | ICD-10-CM

## 2023-01-18 DIAGNOSIS — J4520 Mild intermittent asthma, uncomplicated: Secondary | ICD-10-CM | POA: Diagnosis not present

## 2023-01-18 DIAGNOSIS — O9089 Other complications of the puerperium, not elsewhere classified: Secondary | ICD-10-CM | POA: Diagnosis not present

## 2023-01-18 DIAGNOSIS — R252 Cramp and spasm: Secondary | ICD-10-CM

## 2023-01-18 NOTE — MAU Note (Signed)
Pt's husband came to desk and asked if RN could check his wife's oxygen level. Pt reassured her v/s are normal. States her chest felt alittle tighter and she used the inhaler. Pressure is alittle better in her head. Everything else about the same.

## 2023-01-18 NOTE — MAU Note (Signed)
Pt states since using her inhaler her chest feels better.

## 2023-02-08 ENCOUNTER — Telehealth (HOSPITAL_COMMUNITY): Payer: Self-pay | Admitting: *Deleted

## 2023-02-08 NOTE — Telephone Encounter (Signed)
02/08/2023  Name: Megan Cobb MRN: [id number] DOB: 12/18/1990  Reason for Call:  Transition of Care Hospital Discharge Call  Contact Status: Patient Contact Status: Complete  Language assistant needed: Interpreter Mode: Interpreter Not Needed        Follow-Up Questions: Do You Have Any Concerns About Your Health As You Heal From Delivery?: No Do You Have Any Concerns About Your Infants Health?: No  Edinburgh Postnatal Depression Scale:  In the Past 7 Days: I have been able to laugh and see the funny side of things.: Not quite so much now I have looked forward with enjoyment to things.: As much as I ever did I have blamed myself unnecessarily when things went wrong.: Not very often I have been anxious or worried for no good reason.: No, not at all I have felt scared or panicky for no good reason.: No, not at all Things have been getting on top of me.: No, most of the time I have coped quite well I have been so unhappy that I have had difficulty sleeping.: Not at all I have felt sad or miserable.: Not very often I have been so unhappy that I have been crying.: No, never The thought of harming myself has occurred to me.: Never Edinburgh Postnatal Depression Scale Total: 4  PHQ2-9 Depression Scale:     Discharge Follow-up: Edinburgh score requires follow up?: No Patient was advised of the following resources:: Support Group, Breastfeeding Support Group  Post-discharge interventions: Reviewed Newborn Safe Sleep Practices  Salena Saner, RN 02/08/2023 15:42

## 2023-04-27 ENCOUNTER — Encounter: Payer: Self-pay | Admitting: Neurology

## 2023-05-31 ENCOUNTER — Encounter: Payer: Self-pay | Admitting: Neurology

## 2023-05-31 ENCOUNTER — Ambulatory Visit: Payer: Medicaid Other | Admitting: Neurology

## 2023-10-07 ENCOUNTER — Other Ambulatory Visit: Payer: Self-pay

## 2023-10-07 ENCOUNTER — Emergency Department (HOSPITAL_BASED_OUTPATIENT_CLINIC_OR_DEPARTMENT_OTHER)

## 2023-10-07 ENCOUNTER — Encounter (HOSPITAL_BASED_OUTPATIENT_CLINIC_OR_DEPARTMENT_OTHER): Payer: Self-pay | Admitting: *Deleted

## 2023-10-07 ENCOUNTER — Emergency Department (HOSPITAL_BASED_OUTPATIENT_CLINIC_OR_DEPARTMENT_OTHER): Admission: EM | Admit: 2023-10-07 | Discharge: 2023-10-07 | Disposition: A | Discharge status: Home / Self Care

## 2023-10-07 DIAGNOSIS — M79601 Pain in right arm: Secondary | ICD-10-CM | POA: Insufficient documentation

## 2023-10-07 DIAGNOSIS — R1032 Left lower quadrant pain: Secondary | ICD-10-CM | POA: Insufficient documentation

## 2023-10-07 DIAGNOSIS — M25572 Pain in left ankle and joints of left foot: Secondary | ICD-10-CM | POA: Diagnosis not present

## 2023-10-07 DIAGNOSIS — Y92410 Unspecified street and highway as the place of occurrence of the external cause: Secondary | ICD-10-CM | POA: Diagnosis not present

## 2023-10-07 DIAGNOSIS — M79631 Pain in right forearm: Secondary | ICD-10-CM | POA: Insufficient documentation

## 2023-10-07 DIAGNOSIS — R1031 Right lower quadrant pain: Secondary | ICD-10-CM | POA: Insufficient documentation

## 2023-10-07 LAB — URINALYSIS, ROUTINE W REFLEX MICROSCOPIC
Bilirubin Urine: NEGATIVE
Glucose, UA: NEGATIVE mg/dL
Hgb urine dipstick: NEGATIVE
Ketones, ur: NEGATIVE mg/dL
Nitrite: NEGATIVE
Specific Gravity, Urine: 1.033 — ABNORMAL HIGH (ref 1.005–1.030)
pH: 5.5 (ref 5.0–8.0)

## 2023-10-07 LAB — CBC
HCT: 40 % (ref 36.0–46.0)
Hemoglobin: 12.8 g/dL (ref 12.0–15.0)
MCH: 25.4 pg — ABNORMAL LOW (ref 26.0–34.0)
MCHC: 32 g/dL (ref 30.0–36.0)
MCV: 79.5 fL — ABNORMAL LOW (ref 80.0–100.0)
Platelets: 239 10*3/uL (ref 150–400)
RBC: 5.03 MIL/uL (ref 3.87–5.11)
RDW: 13.9 % (ref 11.5–15.5)
WBC: 8.4 10*3/uL (ref 4.0–10.5)
nRBC: 0 % (ref 0.0–0.2)

## 2023-10-07 LAB — COMPREHENSIVE METABOLIC PANEL WITH GFR
ALT: 16 U/L (ref 0–44)
AST: 18 U/L (ref 15–41)
Albumin: 4.5 g/dL (ref 3.5–5.0)
Alkaline Phosphatase: 109 U/L (ref 38–126)
Anion gap: 13 (ref 5–15)
BUN: 18 mg/dL (ref 6–20)
CO2: 24 mmol/L (ref 22–32)
Calcium: 10.3 mg/dL (ref 8.9–10.3)
Chloride: 103 mmol/L (ref 98–111)
Creatinine, Ser: 0.79 mg/dL (ref 0.44–1.00)
GFR, Estimated: >60 mL/min (ref >60)
Glucose, Bld: 89 mg/dL (ref 70–99)
Potassium: 3.5 mmol/L (ref 3.5–5.1)
Sodium: 140 mmol/L (ref 135–145)
Total Bilirubin: <0.2 mg/dL (ref 0.0–1.2)
Total Protein: 7.8 g/dL (ref 6.5–8.1)

## 2023-10-07 LAB — HCG, SERUM, QUALITATIVE: Preg, Serum: NEGATIVE

## 2023-10-07 MED ORDER — IOHEXOL 300 MG/ML  SOLN
100.0000 mL | Freq: Once | INTRAMUSCULAR | Status: AC | PRN
  Administered 2023-10-07: 100 mL via INTRAVENOUS

## 2023-10-07 MED ORDER — CYCLOBENZAPRINE HCL 5 MG PO TABS
5.0000 mg | ORAL_TABLET | Freq: Once | ORAL | Status: AC
  Administered 2023-10-07: 5 mg via ORAL
  Filled 2023-10-07: qty 1

## 2023-10-07 MED ORDER — OXYCODONE-ACETAMINOPHEN 5-325 MG PO TABS
1.0000 | ORAL_TABLET | Freq: Once | ORAL | Status: AC
  Administered 2023-10-07: 1 via ORAL
  Filled 2023-10-07: qty 1

## 2023-10-07 MED ORDER — LIDOCAINE 5 % EX PTCH: 1.0000 | MEDICATED_PATCH | CUTANEOUS | 0 refills | Status: AC

## 2023-10-07 MED ORDER — ACETAMINOPHEN 325 MG PO TABS
650.0000 mg | ORAL_TABLET | Freq: Once | ORAL | Status: AC
  Administered 2023-10-07: 650 mg via ORAL
  Filled 2023-10-07: qty 2
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
## 2023-10-07 NOTE — Discharge Instructions (Signed)
 You may take over-the-counter Tylenol  alternating with ibuprofen  for pain.  Turn immediately if develop sudden onset headache, vision changes, facial droop, chest pain, shortness of breath, severe abdominal pain inability to eat or drink due to nausea vomiting or any new or worsening symptoms that are concerning to you.
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
## 2023-10-13 ENCOUNTER — Ambulatory Visit: Payer: Self-pay
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
## 2024-01-11 IMAGING — US US BREAST*R* LIMITED INC AXILLA
1 series · 11 of 11 positions shown · non-contrast
Comparison: None.

CLINICAL DATA: Patient presents for palpable mass within the right
breast.

EXAM:
ULTRASOUND OF THE RIGHT BREAST

[Series 1: us breast*right* limited inc axilla · 0.06mm/px · 11 acquisitions, 11 frames shown]
[im 1/11]
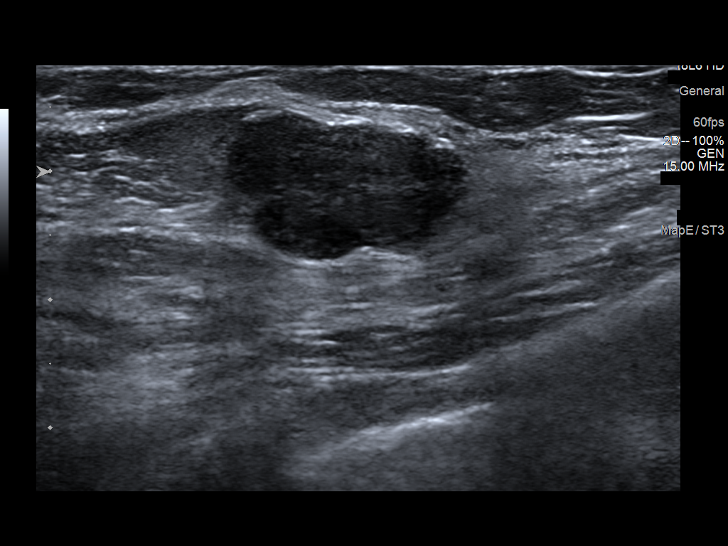
[im 2/11]
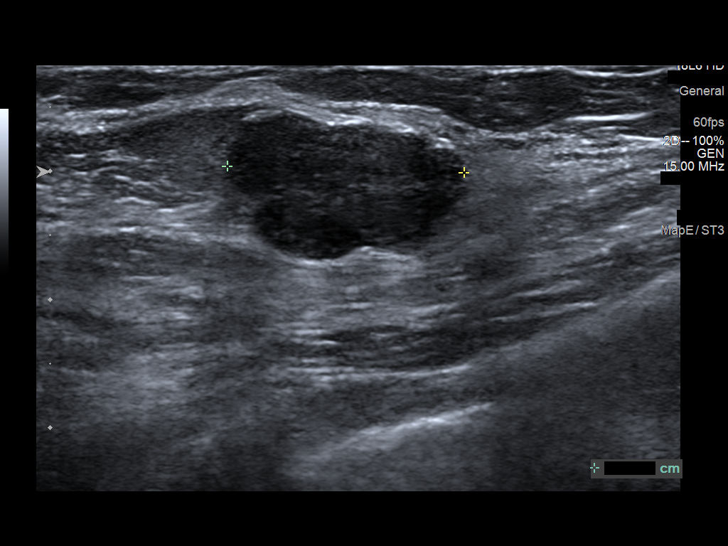
[im 3/11]
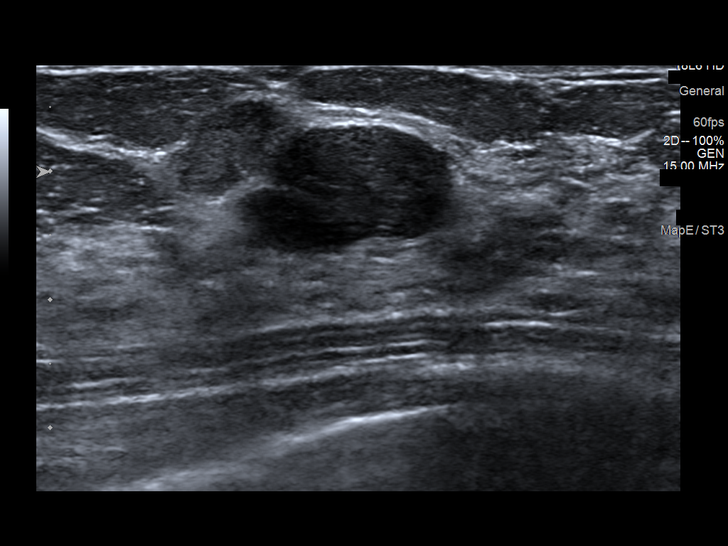
[im 4/11]
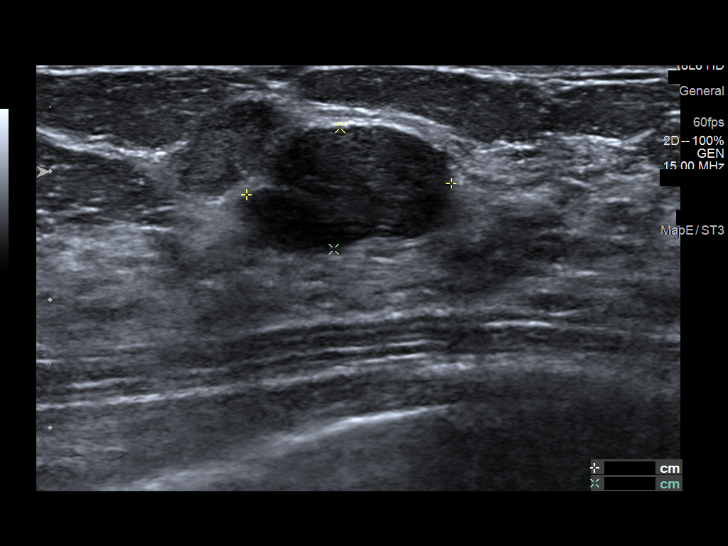
[im 5/11]
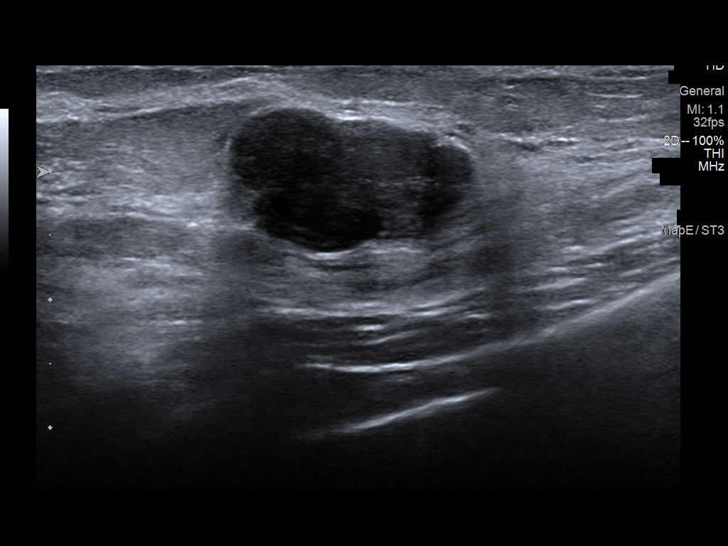
[im 6/11]
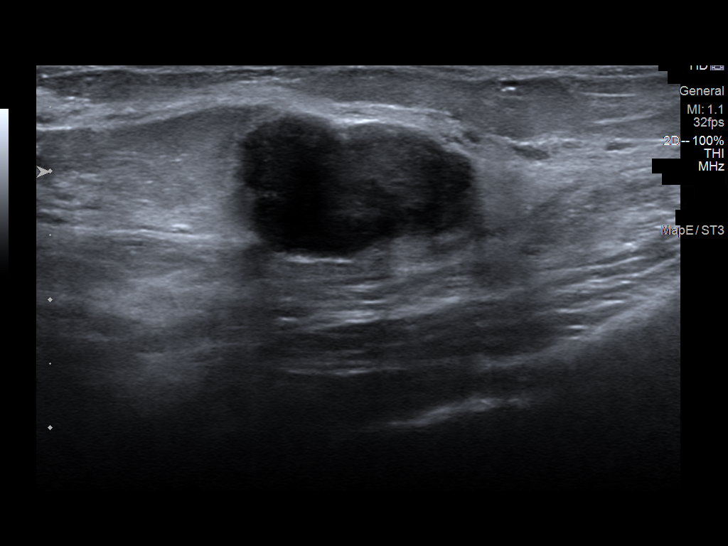
[im 7/11]
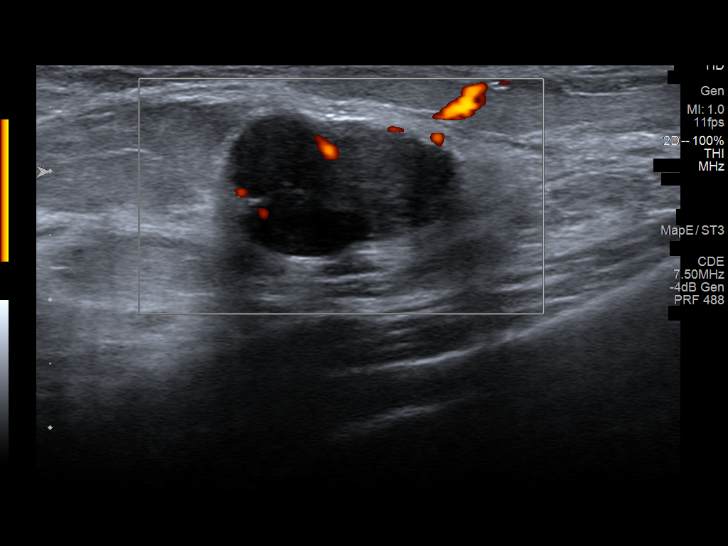
[im 8/11]
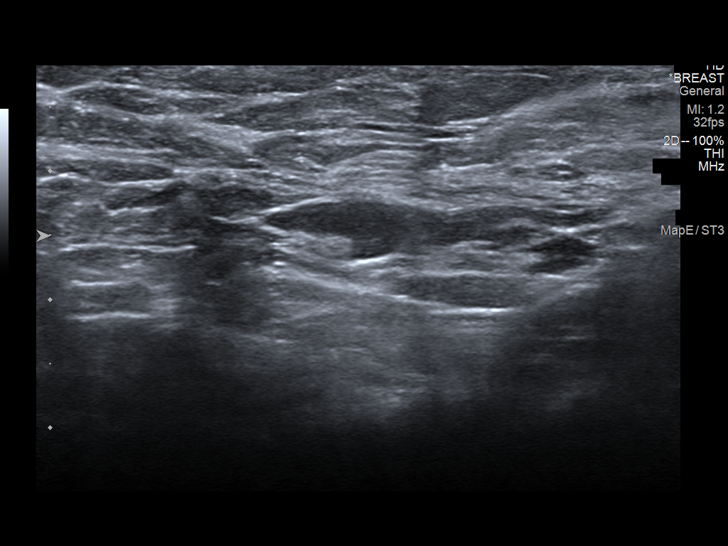
[im 9/11]
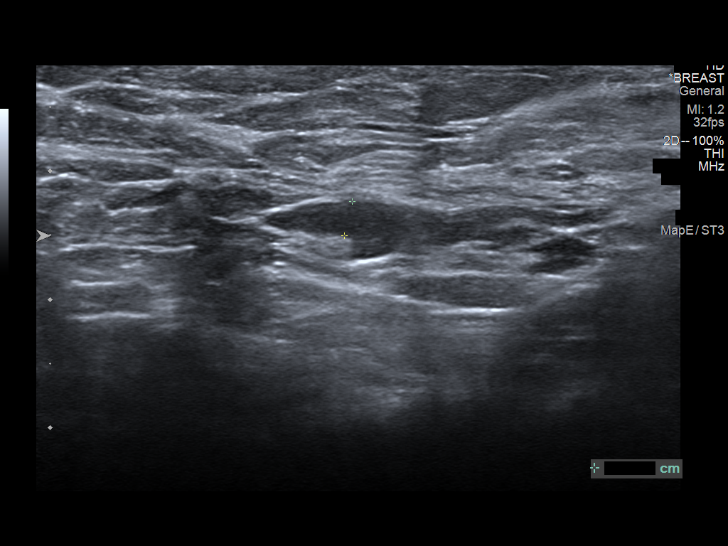
[im 10/11]
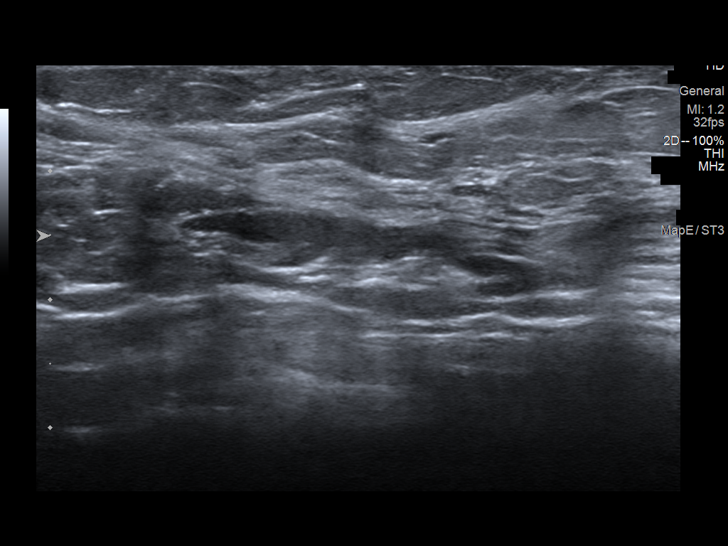
[im 11/11]
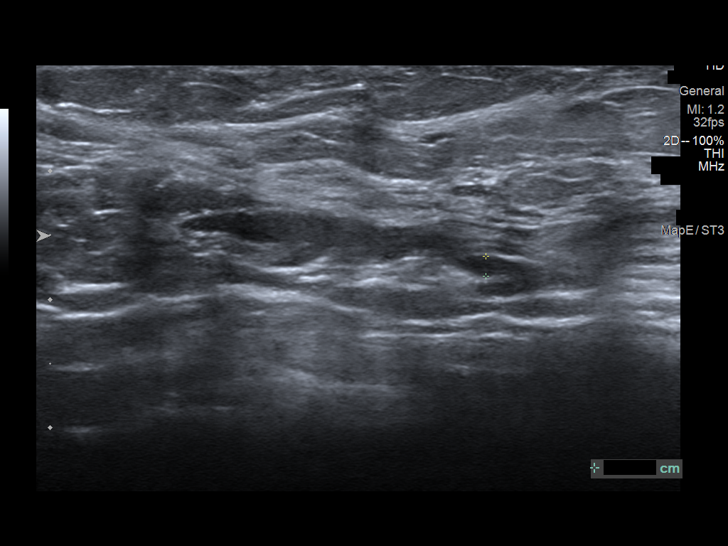

[11 of 11 positions shown; findings below may reference images not displayed]

FINDINGS: On physical exam, there is a small mobile mass within the medial
right breast.

Targeted ultrasound is performed, showing a 1.8 x 1.6 x 1.0 cm
lobular hypoechoic mass right breast 3 o'clock position 6 cm from
nipple.
IMPRESSION: Probably benign right breast mass, favored to represent a
fibroadenoma.

RECOMMENDATION:
We discussed management options including excision,
ultrasound-guided core biopsy, and short term interval follow-up.
Follow-up ultrasound is recommended at 6, 12,and 24 months to assess
stability. The patient concurs with this plan.

I have discussed the findings and recommendations with the patient.
If applicable, a reminder letter will be sent to the patient
regarding the next appointment.

BI-RADS CATEGORY  3: Probably benign.

## 2024-10-14 ENCOUNTER — Ambulatory Visit: Admitting: Dermatology
